# Patient Record
Sex: Female | Born: 1985 | Race: Black or African American | Hispanic: No | Marital: Single | Smoking: Current every day smoker
Health system: Southern US, Community
[De-identification: ages and names within clinical notes are randomized; demographics above are authoritative.]

## PROBLEM LIST (undated history)

## (undated) ENCOUNTER — Inpatient Hospital Stay: Payer: Self-pay

## (undated) DIAGNOSIS — I10 Essential (primary) hypertension: Secondary | ICD-10-CM

## (undated) DIAGNOSIS — Z789 Other specified health status: Secondary | ICD-10-CM

---
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
## 2006-01-07 ENCOUNTER — Emergency Department: Payer: Self-pay | Admitting: Emergency Medicine

## 2006-01-28 ENCOUNTER — Emergency Department: Payer: Self-pay | Admitting: General Practice

## 2006-04-01 ENCOUNTER — Emergency Department: Payer: Self-pay | Admitting: Emergency Medicine

## 2006-09-08 ENCOUNTER — Emergency Department: Payer: Self-pay | Admitting: Emergency Medicine

## 2006-09-23 ENCOUNTER — Emergency Department: Payer: Self-pay | Admitting: Unknown Physician Specialty

## 2006-12-10 ENCOUNTER — Observation Stay: Payer: Self-pay

## 2007-04-12 ENCOUNTER — Observation Stay: Payer: Self-pay | Admitting: Obstetrics and Gynecology

## 2007-04-21 ENCOUNTER — Observation Stay: Payer: Self-pay | Admitting: Obstetrics and Gynecology

## 2007-04-23 ENCOUNTER — Inpatient Hospital Stay: Payer: Self-pay | Admitting: Certified Nurse Midwife

## 2008-01-01 ENCOUNTER — Emergency Department: Payer: Self-pay | Admitting: Emergency Medicine

## 2008-03-15 ENCOUNTER — Observation Stay: Payer: Self-pay | Admitting: Obstetrics and Gynecology

## 2008-07-17 ENCOUNTER — Observation Stay: Payer: Self-pay | Admitting: Obstetrics and Gynecology

## 2009-10-05 ENCOUNTER — Emergency Department: Payer: Self-pay | Admitting: Emergency Medicine

## 2009-12-17 ENCOUNTER — Emergency Department: Payer: Self-pay | Admitting: Emergency Medicine

## 2010-02-05 ENCOUNTER — Emergency Department: Payer: Self-pay | Admitting: Emergency Medicine

## 2011-12-27 ENCOUNTER — Emergency Department: Payer: Self-pay | Admitting: Emergency Medicine

## 2012-04-19 ENCOUNTER — Emergency Department: Payer: Self-pay | Admitting: Emergency Medicine

## 2012-04-20 LAB — CBC
HCT: 40.9 % (ref 35.0–47.0)
HGB: 13.9 g/dL (ref 12.0–16.0)
MCH: 29 pg (ref 26.0–34.0)
MCHC: 34 g/dL (ref 32.0–36.0)
MCV: 85 fL (ref 80–100)
Platelet: 215 10*3/uL (ref 150–440)
RDW: 14.6 % — ABNORMAL HIGH (ref 11.5–14.5)
WBC: 12.4 10*3/uL — ABNORMAL HIGH (ref 3.6–11.0)

## 2012-04-20 LAB — URINALYSIS, COMPLETE
Glucose,UR: NEGATIVE mg/dL (ref 0–75)
Ketone: NEGATIVE
RBC,UR: 112 /HPF (ref 0–5)
Specific Gravity: 1.019 (ref 1.003–1.030)

## 2012-04-20 LAB — WET PREP, GENITAL

## 2012-04-20 LAB — COMPREHENSIVE METABOLIC PANEL
Albumin: 3.5 g/dL (ref 3.4–5.0)
Alkaline Phosphatase: 86 U/L (ref 50–136)
BUN: 11 mg/dL (ref 7–18)
Bilirubin,Total: 0.4 mg/dL (ref 0.2–1.0)
Calcium, Total: 8.4 mg/dL — ABNORMAL LOW (ref 8.5–10.1)
Chloride: 104 mmol/L (ref 98–107)
Creatinine: 1.02 mg/dL (ref 0.60–1.30)
EGFR (Non-African Amer.): >60
Glucose: 102 mg/dL — ABNORMAL HIGH (ref 65–99)
Osmolality: 270 (ref 275–301)
Potassium: 3.8 mmol/L (ref 3.5–5.1)
SGPT (ALT): 19 U/L (ref 12–78)
Total Protein: 7.8 g/dL (ref 6.4–8.2)

## 2012-04-20 LAB — LIPASE, BLOOD: Lipase: 91 U/L (ref 73–393)

## 2012-04-21 LAB — URINE CULTURE

## 2012-04-23 ENCOUNTER — Emergency Department: Payer: Self-pay | Admitting: Emergency Medicine

## 2012-04-23 LAB — COMPREHENSIVE METABOLIC PANEL
Albumin: 3.3 g/dL — ABNORMAL LOW (ref 3.4–5.0)
Alkaline Phosphatase: 80 U/L (ref 50–136)
Calcium, Total: 8.3 mg/dL — ABNORMAL LOW (ref 8.5–10.1)
EGFR (Non-African Amer.): >60
Glucose: 93 mg/dL (ref 65–99)
Osmolality: 276 (ref 275–301)
Potassium: 3.8 mmol/L (ref 3.5–5.1)
SGOT(AST): 14 U/L — ABNORMAL LOW (ref 15–37)
SGPT (ALT): 15 U/L (ref 12–78)
Sodium: 139 mmol/L (ref 136–145)
Total Protein: 7.4 g/dL (ref 6.4–8.2)

## 2012-04-23 LAB — DRUG SCREEN, URINE
Barbiturates, Ur Screen: NEGATIVE (ref ?–200)
Cannabinoid 50 Ng, Ur ~~LOC~~: NEGATIVE (ref ?–50)
Cocaine Metabolite,Ur ~~LOC~~: NEGATIVE (ref ?–300)
MDMA (Ecstasy)Ur Screen: NEGATIVE (ref ?–500)
Methadone, Ur Screen: NEGATIVE (ref ?–300)
Opiate, Ur Screen: NEGATIVE (ref ?–300)
Phencyclidine (PCP) Ur S: NEGATIVE (ref ?–25)

## 2012-04-23 LAB — URINALYSIS, COMPLETE
Bilirubin,UR: NEGATIVE
Glucose,UR: NEGATIVE mg/dL (ref 0–75)
Nitrite: NEGATIVE
Protein: NEGATIVE
RBC,UR: 3050 /HPF (ref 0–5)
WBC UR: 9 /HPF (ref 0–5)

## 2012-04-23 LAB — RAPID INFLUENZA A&B ANTIGENS

## 2012-04-23 LAB — CBC
HCT: 39.9 % (ref 35.0–47.0)
HGB: 13.5 g/dL (ref 12.0–16.0)
MCH: 29 pg (ref 26.0–34.0)
MCHC: 33.8 g/dL (ref 32.0–36.0)
RBC: 4.65 10*6/uL (ref 3.80–5.20)
RDW: 14.5 % (ref 11.5–14.5)
WBC: 6.7 10*3/uL (ref 3.6–11.0)

## 2013-06-14 ENCOUNTER — Emergency Department: Payer: Self-pay | Admitting: Internal Medicine

## 2013-06-14 LAB — COMPREHENSIVE METABOLIC PANEL
ALK PHOS: 77 U/L
AST: 9 U/L — AB (ref 15–37)
Albumin: 3.3 g/dL — ABNORMAL LOW (ref 3.4–5.0)
Anion Gap: 5 — ABNORMAL LOW (ref 7–16)
BUN: 10 mg/dL (ref 7–18)
Bilirubin,Total: 0.3 mg/dL (ref 0.2–1.0)
CALCIUM: 8.6 mg/dL (ref 8.5–10.1)
CHLORIDE: 110 mmol/L — AB (ref 98–107)
Co2: 24 mmol/L (ref 21–32)
Creatinine: 0.8 mg/dL (ref 0.60–1.30)
EGFR (African American): >60
EGFR (Non-African Amer.): >60
GLUCOSE: 89 mg/dL (ref 65–99)
OSMOLALITY: 276 (ref 275–301)
Potassium: 4.1 mmol/L (ref 3.5–5.1)
SGPT (ALT): 16 U/L (ref 12–78)
Sodium: 139 mmol/L (ref 136–145)
Total Protein: 7.2 g/dL (ref 6.4–8.2)

## 2013-06-14 LAB — CBC WITH DIFFERENTIAL/PLATELET
Basophil #: 0 10*3/uL (ref 0.0–0.1)
Basophil %: 0.6 %
Eosinophil #: 0.2 10*3/uL (ref 0.0–0.7)
Eosinophil %: 3.4 %
HCT: 38.2 % (ref 35.0–47.0)
HGB: 13 g/dL (ref 12.0–16.0)
LYMPHS PCT: 36.6 %
Lymphocyte #: 2.7 10*3/uL (ref 1.0–3.6)
MCH: 29.5 pg (ref 26.0–34.0)
MCHC: 34 g/dL (ref 32.0–36.0)
MCV: 87 fL (ref 80–100)
MONOS PCT: 6.4 %
Monocyte #: 0.5 x10 3/mm (ref 0.2–0.9)
Neutrophil #: 3.8 10*3/uL (ref 1.4–6.5)
Neutrophil %: 53 %
Platelet: 253 10*3/uL (ref 150–440)
RBC: 4.41 10*6/uL (ref 3.80–5.20)
RDW: 13.7 % (ref 11.5–14.5)
WBC: 7.3 10*3/uL (ref 3.6–11.0)

## 2013-06-14 LAB — URINALYSIS, COMPLETE
Bacteria: NONE SEEN
SPECIFIC GRAVITY: 1.023 (ref 1.003–1.030)
SQUAMOUS EPITHELIAL: NONE SEEN
WBC UR: NONE SEEN /HPF (ref 0–5)

## 2013-06-14 LAB — LIPASE, BLOOD: Lipase: 110 U/L (ref 73–393)

## 2013-06-14 LAB — HCG, QUANTITATIVE, PREGNANCY: Beta Hcg, Quant.: <1 m[IU]/mL — ABNORMAL LOW

## 2014-04-30 ENCOUNTER — Emergency Department: Payer: Self-pay | Admitting: Emergency Medicine

## 2015-04-02 ENCOUNTER — Emergency Department: Payer: Self-pay

## 2015-04-02 ENCOUNTER — Emergency Department
Admission: EM | Admit: 2015-04-02 | Discharge: 2015-04-02 | Disposition: A | Discharge status: Home / Self Care | Payer: Self-pay | Attending: Emergency Medicine | Admitting: Emergency Medicine

## 2015-04-02 ENCOUNTER — Encounter: Payer: Self-pay | Admitting: Emergency Medicine

## 2015-04-02 DIAGNOSIS — F17200 Nicotine dependence, unspecified, uncomplicated: Secondary | ICD-10-CM | POA: Insufficient documentation

## 2015-04-02 DIAGNOSIS — O99331 Smoking (tobacco) complicating pregnancy, first trimester: Secondary | ICD-10-CM | POA: Insufficient documentation

## 2015-04-02 DIAGNOSIS — O200 Threatened abortion: Secondary | ICD-10-CM

## 2015-04-02 DIAGNOSIS — Z3A01 Less than 8 weeks gestation of pregnancy: Secondary | ICD-10-CM | POA: Insufficient documentation

## 2015-04-02 LAB — URINALYSIS COMPLETE WITH MICROSCOPIC (ARMC ONLY)
Bilirubin Urine: NEGATIVE
Glucose, UA: NEGATIVE mg/dL
Ketones, ur: NEGATIVE mg/dL
Leukocytes, UA: NEGATIVE
Nitrite: NEGATIVE
Protein, ur: NEGATIVE mg/dL
Specific Gravity, Urine: 1.021 (ref 1.005–1.030)
pH: 6 (ref 5.0–8.0)

## 2015-04-02 LAB — BASIC METABOLIC PANEL WITH GFR
Anion gap: 5 (ref 5–15)
BUN: 15 mg/dL (ref 6–20)
CO2: 22 mmol/L (ref 22–32)
Calcium: 8.9 mg/dL (ref 8.9–10.3)
Chloride: 110 mmol/L (ref 101–111)
Creatinine, Ser: 0.84 mg/dL (ref 0.44–1.00)
GFR calc Af Amer: >60 mL/min
GFR calc non Af Amer: >60 mL/min
Glucose, Bld: 86 mg/dL (ref 65–99)
Potassium: 3.9 mmol/L (ref 3.5–5.1)
Sodium: 137 mmol/L (ref 135–145)

## 2015-04-02 LAB — CBC
HCT: 40.3 % (ref 35.0–47.0)
Hemoglobin: 13.1 g/dL (ref 12.0–16.0)
MCH: 28.7 pg (ref 26.0–34.0)
MCHC: 32.6 g/dL (ref 32.0–36.0)
MCV: 87.9 fL (ref 80.0–100.0)
PLATELETS: 254 10*3/uL (ref 150–440)
RBC: 4.58 MIL/uL (ref 3.80–5.20)
RDW: 13.8 % (ref 11.5–14.5)
WBC: 12.1 10*3/uL — AB (ref 3.6–11.0)

## 2015-04-02 LAB — HCG, QUANTITATIVE, PREGNANCY: hCG, Beta Chain, Quant, S: 27093 m[IU]/mL — ABNORMAL HIGH (ref <5)

## 2015-04-02 MED ORDER — ACETAMINOPHEN 500 MG PO TABS
1000.0000 mg | ORAL_TABLET | Freq: Once | ORAL | Status: AC
  Administered 2015-04-02: 1000 mg via ORAL
  Filled 2015-04-02: qty 2

## 2015-04-02 MED ORDER — NITROGLYCERIN 0.4 MG SL SUBL: 0.4000 mg | SUBLINGUAL_TABLET | Freq: Once | SUBLINGUAL | Status: DC

## 2015-04-02 MED ORDER — FUROSEMIDE 10 MG/ML IJ SOLN: 160.0000 mg | Freq: Once | INTRAMUSCULAR | Status: DC

## 2015-04-02 NOTE — ED Provider Notes (Signed)
[facility] Emergency Department Provider Note  Time seen: 8:35 AM  I have reviewed the triage vital signs and the nursing notes.   HISTORY  Chief Complaint Vaginal Bleeding    HPI Vicki Wiley is a 29 y.o. female with no past medical history, G3 P2, who presents the emergency department vaginal spotting. According to the patient she took a pregnancy test 2 weeks ago which was positive. She is not entirely sure when her last period was, states she had when the beginning of October and then again at the end of October. States she was on birth control until she came off of this in September. For the past 3 days if patient has noted intermittent vaginal spotting, with mild lower abdominal cramping. She has been taking Tylenol for the cramping with some relief. Denies any passage of tissue.     History reviewed. No pertinent past medical history.  There are no active problems to display for this patient.   History reviewed. No pertinent past surgical history.  No current outpatient prescriptions on file.  Allergies Review of patient's allergies indicates no known allergies.  History reviewed. No pertinent family history.  Social History Social History  Substance Use Topics  . Smoking status: Current Every Day Smoker  . Smokeless tobacco: None  . Alcohol Use: No    Review of Systems Constitutional: Negative for fever. Cardiovascular: Negative for chest pain. Respiratory: Negative for shortness of breath. Gastrointestinal: Mild lower abdominal cramping. Negative nausea, vomiting, diarrhea. Genitourinary: Negative for dysuria Neurological: Negative for headache 10-point ROS otherwise negative.  ____________________________________________   PHYSICAL EXAM:  VITAL SIGNS: ED Triage Vitals  Enc Vitals Group     BP 04/02/15 0823 129/71 mmHg     Pulse Rate 04/02/15 0823 105     Resp 04/02/15 0823 20     Temp 04/02/15 0823 98 F (36.7 C)   Temp Source 04/02/15 0823 Oral     SpO2 04/02/15 0823 100 %     Weight 04/02/15 0823 203 lb (92.08 kg)     Height 04/02/15 0823  (1.676 m)     Head Cir --      Peak Flow --      Pain Score 04/02/15 0823 8     Pain Loc --      Pain Edu? --      Excl. in GC? --     Constitutional: Alert and oriented. Well appearing and in no distress. Eyes: Normal exam ENT   Head: Normocephalic and atraumatic.   Mouth/Throat: Mucous membranes are moist. Cardiovascular: Normal rate, regular rhythm. No murmur Respiratory: Normal respiratory effort without tachypnea nor retractions. Breath sounds are clear Gastrointestinal: Soft, mild lower abdominal tenderness palpation. No rebound or guarding. No distention. Musculoskeletal: Nontender with normal range of motion in all extremities. Neurologic:  Normal speech and language. No gross focal neurologic deficits Skin:  Skin is warm, dry and intact.  Psychiatric: Mood and affect are normal. Speech and behavior are normal.  ____________________________________________   RADIOLOGY  Single intrauterine pregnancy with heart rate of 152  ____________________________________________    INITIAL IMPRESSION / ASSESSMENT AND PLAN / ED COURSE  Pertinent labs & imaging results that were available during my care of the patient were reviewed by me and considered in my medical decision making (see chart for details).  Patient presents the emergency department with vaginal spotting, and a positive pregnancy test. We'll check labs including beta hCG, ultrasound, urinalysis, and closely monitor in the  emergency department.  Ultrasound consistent with single intrauterine pregnancy. Patient does have a cystic structure adjacent to the left ovary which has been discussed with the patient she'll follow-up with her OB/GYN regarding this. We will discharge the patient home at this time.  ____________________________________________   FINAL CLINICAL  IMPRESSION(S) / ED DIAGNOSES  Threatened abortion   Minna AntisKevin Honesty Menta, MD 04/02/15 1308

## 2015-04-02 NOTE — ED Notes (Signed)
Patient transported to Ultrasound 

## 2015-04-02 NOTE — Discharge Instructions (Signed)
You have been seen in the emergency department for vaginal bleeding. Her workup today is consistent with a early pregnancy. Please follow-up your OB/GYN for further prenatal care. Your ultrasound also shows a left ovarian cyst, please follow-up with OB/GYN regarding this to ensure resolution.   Threatened Miscarriage A threatened miscarriage occurs when you have vaginal bleeding during your first 20 weeks of pregnancy but the pregnancy has not ended. If you have vaginal bleeding during this time, your health care provider will do tests to make sure you are still pregnant. If the tests show you are still pregnant and the developing baby (fetus) inside your womb (uterus) is still growing, your condition is considered a threatened miscarriage. A threatened miscarriage does not mean your pregnancy will end, but it does increase the risk of losing your pregnancy (complete miscarriage). CAUSES  The cause of a threatened miscarriage is usually not known. If you go on to have a complete miscarriage, the most common cause is an abnormal number of chromosomes in the developing baby. Chromosomes are the structures inside cells that hold all your genetic material. Some causes of vaginal bleeding that do not result in miscarriage include:  Having sex.  Having an infection.  Normal hormone changes of pregnancy.  Bleeding that occurs when an egg implants in your uterus. RISK FACTORS Risk factors for bleeding in early pregnancy include:  Obesity.  Smoking.  Drinking excessive amounts of alcohol or caffeine.  Recreational drug use. SIGNS AND SYMPTOMS  Light vaginal bleeding.  Mild abdominal pain or cramps. DIAGNOSIS  If you have bleeding with or without abdominal pain before 20 weeks of pregnancy, your health care provider will do tests to check whether you are still pregnant. One important test involves using sound waves and a computer (ultrasound) to create images of the inside of your uterus.  Other tests include an internal exam of your vagina and uterus (pelvic exam) and measurement of your baby's heart rate.  You may be diagnosed with a threatened miscarriage if:  Ultrasound testing shows you are still pregnant.  Your baby's heart rate is strong.  A pelvic exam shows that the opening between your uterus and your vagina (cervix) is closed.  Your heart rate and blood pressure are stable.  Blood tests confirm you are still pregnant. TREATMENT  No treatments have been shown to prevent a threatened miscarriage from going on to a complete miscarriage. However, the right home care is important.  HOME CARE INSTRUCTIONS   Make sure you keep all your appointments for prenatal care. This is very important.  Get plenty of rest.  Do not have sex or use tampons if you have vaginal bleeding.  Do not douche.  Do not smoke or use recreational drugs.  Do not drink alcohol.  Avoid caffeine. SEEK MEDICAL CARE IF:  You have light vaginal bleeding or spotting while pregnant.  You have abdominal pain or cramping.  You have a fever. SEEK IMMEDIATE MEDICAL CARE IF:  You have heavy vaginal bleeding.  You have blood clots coming from your vagina.  You have severe low back pain or abdominal cramps.  You have fever, chills, and severe abdominal pain. MAKE SURE YOU:  Understand these instructions.  Will watch your condition.  Will get help right away if you are not doing well or get worse.   This information is not intended to replace advice given to you by your health care provider. Make sure you discuss any questions you have with your health care  provider.   Document Released: 04/02/2005 Document Revised: 04/07/2013 Document Reviewed: 01/27/2013 Elsevier Interactive Patient Education [employer]2016 [employer] Inc.

## 2015-04-02 NOTE — ED Notes (Signed)
Pt to ed with c/o vaginal bleeding and spotting,  Pt reports she has had 2 positive pregnancy tests in the last 2 weeks.

## 2015-08-22 ENCOUNTER — Encounter: Payer: Self-pay | Admitting: Emergency Medicine

## 2015-08-22 ENCOUNTER — Emergency Department
Admission: EM | Admit: 2015-08-22 | Discharge: 2015-08-22 | Disposition: A | Discharge status: Home / Self Care | Payer: Medicaid Other | Attending: Emergency Medicine | Admitting: Emergency Medicine

## 2015-08-22 ENCOUNTER — Emergency Department: Payer: Medicaid Other

## 2015-08-22 DIAGNOSIS — O26891 Other specified pregnancy related conditions, first trimester: Secondary | ICD-10-CM | POA: Diagnosis not present

## 2015-08-22 DIAGNOSIS — O26899 Other specified pregnancy related conditions, unspecified trimester: Secondary | ICD-10-CM

## 2015-08-22 DIAGNOSIS — R102 Pelvic and perineal pain: Secondary | ICD-10-CM | POA: Insufficient documentation

## 2015-08-22 DIAGNOSIS — F17200 Nicotine dependence, unspecified, uncomplicated: Secondary | ICD-10-CM | POA: Insufficient documentation

## 2015-08-22 DIAGNOSIS — R1032 Left lower quadrant pain: Secondary | ICD-10-CM | POA: Diagnosis present

## 2015-08-22 DIAGNOSIS — O200 Threatened abortion: Secondary | ICD-10-CM | POA: Diagnosis not present

## 2015-08-22 DIAGNOSIS — Z3A01 Less than 8 weeks gestation of pregnancy: Secondary | ICD-10-CM | POA: Insufficient documentation

## 2015-08-22 LAB — URINALYSIS COMPLETE WITH MICROSCOPIC (ARMC ONLY)
BILIRUBIN URINE: NEGATIVE
GLUCOSE, UA: NEGATIVE mg/dL
KETONES UR: NEGATIVE mg/dL
LEUKOCYTES UA: NEGATIVE
Nitrite: NEGATIVE
PH: 6 (ref 5.0–8.0)
Protein, ur: NEGATIVE mg/dL
Specific Gravity, Urine: 1.02 (ref 1.005–1.030)

## 2015-08-22 LAB — CHLAMYDIA/NGC RT PCR (ARMC ONLY)
Chlamydia Tr: NOT DETECTED
N GONORRHOEAE: NOT DETECTED

## 2015-08-22 LAB — ABO/RH: ABO/RH(D): A POS

## 2015-08-22 LAB — CBC
HCT: 42.2 % (ref 35.0–47.0)
Hemoglobin: 14.1 g/dL (ref 12.0–16.0)
MCH: 29.2 pg (ref 26.0–34.0)
MCHC: 33.4 g/dL (ref 32.0–36.0)
MCV: 87.5 fL (ref 80.0–100.0)
PLATELETS: 242 10*3/uL (ref 150–440)
RBC: 4.82 MIL/uL (ref 3.80–5.20)
RDW: 13.5 % (ref 11.5–14.5)
WBC: 13.1 10*3/uL — ABNORMAL HIGH (ref 3.6–11.0)

## 2015-08-22 LAB — HCG, QUANTITATIVE, PREGNANCY: hCG, Beta Chain, Quant, S: 61887 m[IU]/mL — ABNORMAL HIGH (ref <5)

## 2015-08-22 LAB — WET PREP, GENITAL
Clue Cells Wet Prep HPF POC: NONE SEEN
SPERM: NONE SEEN
TRICH WET PREP: NONE SEEN
Yeast Wet Prep HPF POC: NONE SEEN

## 2015-08-22 NOTE — ED Provider Notes (Signed)
[facility] Emergency Department Provider Note   ____________________________________________  Time seen: Approximately 2:15 PM  I have reviewed the triage vital signs and the nursing notes.   HISTORY  Chief Complaint Abdominal Pain    HPI Vicki Wiley is a 30 y.o. female who is a G3 P1 who is presenting to the emergency department today with left lower quadrant abdominal pain as well as vaginal spotting. She says that she is about [redacted] weeks pregnant. Is taking prenatal vitamins. Has a history of a left-sided ovarian cyst. Says that the pain is pressure-like and an 8 out of 10 to her left side. Said that she took Tylenol without any relief. Denies any radiation of the pain. Says that she also has a history of kidney stones. [facility]. Says that she smokes but is down to 2 cigarettes per day and is trying to quit completely.   History reviewed. No pertinent past medical history.  There are no active problems to display for this patient.   History reviewed. No pertinent past surgical history.  No current outpatient prescriptions on file.  Allergies Review of patient's allergies indicates no known allergies.  History reviewed. No pertinent family history.  Social History Social History  Substance Use Topics  . Smoking status: Current Every Day Smoker  . Smokeless tobacco: None  . Alcohol Use: No    Review of Systems Constitutional: No fever/chills Eyes: No visual changes. ENT: No sore throat. Cardiovascular: Denies chest pain. Respiratory: Denies shortness of breath. Gastrointestinal:No nausea, no vomiting.  No diarrhea.  No constipation. Genitourinary: Negative for dysuria. Musculoskeletal: Negative for back pain. Skin: Negative for rash. Neurological: Negative for headaches, focal weakness or numbness.  10-point ROS otherwise  negative.  ____________________________________________   PHYSICAL EXAM:  VITAL SIGNS: ED Triage Vitals  Enc Vitals Group     BP 08/22/15 1112 118/66 mmHg     Pulse Rate 08/22/15 1112 84     Resp 08/22/15 1112 20     Temp 08/22/15 1112 97.8 F (36.6 C)     Temp Source 08/22/15 1112 Oral     SpO2 08/22/15 1112 99 %     Weight 08/22/15 1112 232 lb (105.235 kg)     Height 08/22/15 1112 5\' 6"  (1.676 m)     Head Cir --      Peak Flow --      Pain Score 08/22/15 1113 8     Pain Loc --      Pain Edu? --      Excl. in GC? --     Constitutional: Alert and oriented. Well appearing and in no acute distress. Eyes: Conjunctivae are normal. PERRL. EOMI. Head: Atraumatic. Nose: No congestion/rhinnorhea. Mouth/Throat: Mucous membranes are moist.   Neck: No stridor.   Cardiovascular: Normal rate, regular rhythm. Grossly normal heart sounds.  Good peripheral circulation. Respiratory: Normal respiratory effort.  No retractions. Lungs CTAB. Gastrointestinal: Soft With mild left lower quadrant tenderness. No distention.  No CVA tenderness. Genitourinary:  Normal external appearance. Speculum exam with a small amount of clear discharge. Bimanual exam with a closed cervix. No CMT. No uterine or right adnexal tenderness or masses. Mild to moderate tenderness to the left adnexa. Musculoskeletal: No lower extremity tenderness nor edema.  No joint effusions. Neurologic:  Normal speech and language. No gross focal neurologic deficits are appreciated. No gait instability. Skin:  Skin is warm, dry and intact. No rash noted. Psychiatric: Mood  and affect are normal. Speech and behavior are normal.  ____________________________________________   LABS (all labs ordered are listed, but only abnormal results are displayed)  Labs Reviewed  WET PREP, GENITAL - Abnormal; Notable for the following:    WBC, Wet Prep HPF POC FEW (*)    All other components within normal limits  CBC - Abnormal; Notable for  the following:    WBC 13.1 (*)    All other components within normal limits  HCG, QUANTITATIVE, PREGNANCY - Abnormal; Notable for the following:    hCG, Beta Chain, Sharene Butters, [place] [address] (*)    All other components within normal limits  URINALYSIS COMPLETEWITH MICROSCOPIC ([facility] ONLY) - Abnormal; Notable for the following:    Color, Urine AMBER (*)    APPearance CLEAR (*)    Hgb urine dipstick 1+ (*)    Bacteria, UA RARE (*)    Squamous Epithelial / LPF 6-30 (*)    All other components within normal limits  CHLAMYDIA/NGC RT PCR ([facility] ONLY)  ABO/RH   ____________________________________________  EKG   ____________________________________________  RADIOLOGY  Pending ultrasound ____________________________________________   PROCEDURES   ____________________________________________   INITIAL IMPRESSION / ASSESSMENT AND PLAN / ED COURSE  Pertinent labs & imaging results that were available during my care of the patient were reviewed by me and considered in my medical decision making (see chart for details).  ----------------------------------------- 3:35 PM on 08/22/2015 -----------------------------------------  Patient's labs are reassuring. Pending ultrasound at this time. Signed out to Dr. York Cerise. Suspect likely threatened abortion. Patient not in any outward distress. Less likely to be kidney stones. Clear discharge likely physiologic with pregnancy. Has elevated white blood cell count appears chronic. ____________________________________________   FINAL CLINICAL IMPRESSION(S) / ED DIAGNOSES  Final diagnoses:  Pelvic pain affecting pregnancy  Threatened abortion      NEW MEDICATIONS STARTED DURING THIS VISIT:  New Prescriptions   No medications on file     Note:  This document was prepared using Dragon voice recognition software and may include unintentional dictation errors.    Myrna Blazer, MD 08/22/15 [phone]

## 2015-08-22 NOTE — ED Notes (Signed)
Patient transported to Ultrasound 

## 2015-08-22 NOTE — ED Provider Notes (Signed)
----------------------------------------- 3:22 PM on 08/22/2015 -----------------------------------------   Blood pressure 98/63, pulse 70, temperature 97.8 F (36.6 C), temperature source Oral, resp. rate 20, height 5\' 6"  (1.676 m), weight 105.235 kg, last menstrual period 02/14/2015, SpO2 100 %.  Assuming care from Dr. Pershing ProudSchaevitz.  In short, Vicki Wiley is a 30 y.o. female with a chief complaint of Abdominal Pain .  Refer to the original H&P for additional details.  The current plan of care is to follow-up the ultrasounds and GC/chlamydia.  ----------------------------------------- 4:42 PM on 08/22/2015 -----------------------------------------  [place] Ob Comp Less 14 Wks  08/22/2015  CLINICAL DATA:  Left lower quadrant abdominal pain, vaginal spotting, first trimester of pregnancy. EXAM: OBSTETRIC <14 WK US AND TRANSVAGINAL OB US DOPPLER ULTRASOUND OF OVARIES TECHNIQUE: Both transabdominal and transvaginal ultrasound examinations were performed for complete evaluation of the gestation as well as the maternal uterus, adnexal regions, and pelvic cul-de-sac. Transvaginal technique was performed to assess early pregnancy. Color and duplex Doppler ultrasound was utilized to evaluate blood flow to the ovaries. COMPARISON:  Ultrasound of April 02, 2015. FINDINGS: Intrauterine gestational sac: Single. Yolk sac:  Visualized. Embryo:  Visualized. Cardiac Activity: Visualized. Heart Rate: 120 bpm CRL:   8  mm   6 w 5 d                  US EDC: April 11, 2016. Subchorionic hemorrhage:  None visualized. Maternal uterus/adnexae: Small amount of free fluid is noted. Probable corpus luteum cyst seen in right ovary. No significant abnormality seen in left ovary. Pulsed Doppler evaluation of both ovaries demonstrates normal appearing low-resistance arterial and venous waveforms. IMPRESSION: Single live intrauterine gestation of 6 weeks 5 days. Electronically Signed   By: Lupita RaiderJames  Green Jr, M.D.   On: 08/22/2015  16:07   [place] Ob Transvaginal  08/22/2015  CLINICAL DATA:  Left lower quadrant abdominal pain, vaginal spotting, first trimester of pregnancy. EXAM: OBSTETRIC <14 WK US AND TRANSVAGINAL OB US DOPPLER ULTRASOUND OF OVARIES TECHNIQUE: Both transabdominal and transvaginal ultrasound examinations were performed for complete evaluation of the gestation as well as the maternal uterus, adnexal regions, and pelvic cul-de-sac. Transvaginal technique was performed to assess early pregnancy. Color and duplex Doppler ultrasound was utilized to evaluate blood flow to the ovaries. COMPARISON:  Ultrasound of April 02, 2015. FINDINGS: Intrauterine gestational sac: Single. Yolk sac:  Visualized. Embryo:  Visualized. Cardiac Activity: Visualized. Heart Rate: 120 bpm CRL:   8  mm   6 w 5 d                  US EDC: April 11, 2016. Subchorionic hemorrhage:  None visualized. Maternal uterus/adnexae: Small amount of free fluid is noted. Probable corpus luteum cyst seen in right ovary. No significant abnormality seen in left ovary. Pulsed Doppler evaluation of both ovaries demonstrates normal appearing low-resistance arterial and venous waveforms. IMPRESSION: Single live intrauterine gestation of 6 weeks 5 days. Electronically Signed   By: Lupita RaiderJames  Green Jr, M.D.   On: 08/22/2015 16:07   [place] Art/ven Flow Abd Pelv Doppler  08/22/2015  CLINICAL DATA:  Left lower quadrant abdominal pain, vaginal spotting, first trimester of pregnancy. EXAM: OBSTETRIC <14 WK US AND TRANSVAGINAL OB US DOPPLER ULTRASOUND OF OVARIES TECHNIQUE: Both transabdominal and transvaginal ultrasound examinations were performed for complete evaluation of the gestation as well as the maternal uterus, adnexal regions, and pelvic cul-de-sac. Transvaginal technique was performed to assess early pregnancy. Color and duplex Doppler ultrasound was utilized to evaluate blood  flow to the ovaries. COMPARISON:  Ultrasound of April 02, 2015. FINDINGS: Intrauterine gestational  sac: Single. Yolk sac:  Visualized. Embryo:  Visualized. Cardiac Activity: Visualized. Heart Rate: 120 bpm CRL:   8  mm   6 w 5 d                  [place] EDC: April 11, 2016. Subchorionic hemorrhage:  None visualized. Maternal uterus/adnexae: Small amount of free fluid is noted. Probable corpus luteum cyst seen in right ovary. No significant abnormality seen in left ovary. Pulsed Doppler evaluation of both ovaries demonstrates normal appearing low-resistance arterial and venous waveforms. IMPRESSION: Single live intrauterine gestation of 6 weeks 5 days. Electronically Signed   By: Lupita Raider, M.D.   On: 08/22/2015 16:07     Ultrasounds unremarkable.  GC/chlamydia negative.  Wet prep negative.  I provided all the results to the patient and encourage outpatient follow-up as per Dr. Pershing Proud recommendations.  Loleta Rose, MD 08/22/15 [phone]

## 2015-08-22 NOTE — ED Notes (Signed)
Resumed care from amber rn.  Pt in u/s.

## 2015-08-22 NOTE — ED Notes (Signed)
Pt left without signing esignature.  Pt did not get discharge instructions.   Pt not in room at time of discharge.

## 2015-08-22 NOTE — ED Notes (Signed)
Pt alert.  Pt states she has vaginal discharge.

## 2015-08-22 NOTE — ED Notes (Signed)
Pt reports left side abdominal pain. She is [redacted] weeks pregnant and has hx of ovarian cyst. Pt also reports some light spotting since yesterday. Pt alert & oriented with NAD noted.

## 2015-09-22 ENCOUNTER — Ambulatory Visit
Admission: EM | Admit: 2015-09-22 | Discharge: 2015-09-22 | Disposition: A | Discharge status: Home / Self Care | Payer: Self-pay | Attending: Family Medicine | Admitting: Family Medicine

## 2015-09-22 DIAGNOSIS — Z3491 Encounter for supervision of normal pregnancy, unspecified, first trimester: Secondary | ICD-10-CM

## 2015-09-22 DIAGNOSIS — Z331 Pregnant state, incidental: Secondary | ICD-10-CM

## 2015-09-22 HISTORY — DX: Other specified health status: Z78.9

## 2015-09-22 NOTE — ED Provider Notes (Signed)
CSN: [id number]     Arrival date & time 09/22/15  0816 History   First MD Initiated Contact with Patient 09/22/15 [phone]    Nurses notes were reviewed. Chief Complaint  Patient presents with  . New Evaluation    Pt reports [redacted] weeks pregnant and had [facility], then [facility], then had f/u appt at health dept. No heartbeat on doppler and go worried.     Patient is here because of concern of fetal well-being. She reports that she is [redacted] weeks pregnant and she knows this because 2 weeks ago she was having vaginal bleeding and cramping and [facility] which revealed that she was [redacted] weeks pregnant that time. Since then the cramping stopped vaginal bleeding. Her things going well and she went to the health department [place] yesterday at 10 weeks. Apparently they could not hear the fetal heart tones in the Doppler study and because he did not seem to the hospital doing anything she became worried and concerned and came here for further evaluation and treatment. She is [redacted] weeks pregnant at this time and regular Doppler was use to try to hear fetal heart tones.   She is a gravida 4 para 1. She did report having preeclampsia during her first pregnancy. She also smoked up until this pregnancy but she has stopped now.  She should be noted the patient is having no problems right now with pregnancy she denies any active bleeding cramping burning on urination or frequency or headaches. She does feel bloated with gas is coming from the pregnancy she feels and she's had some morning sickness which she states that she thinks a combination of the prenatal vitamins and her smoking and that one was while she has stopped smoking now and no pertinent past surgical history no pertinent past family medical history and she denies any other medical problems at this time either.  (Consider  location/radiation/quality/duration/timing/severity/associated sxs/prior Treatment) Patient is a 30 y.o. female presenting with pregnancy problem. The history is provided by the patient. No language interpreter was used.  Possible Pregnancy This is a new problem. The current episode started more than 1 week ago. The problem occurs constantly. The problem has not changed since onset.Pertinent negatives include no chest pain and no abdominal pain. Nothing aggravates the symptoms. She has tried nothing for the symptoms. The treatment provided no relief.    Past Medical History  Diagnosis Date  . Patient denies medical problems    History reviewed. No pertinent past surgical history. History reviewed. No pertinent family history. Social History  Substance Use Topics  . Smoking status: Former Smoker    Types: Cigarettes  . Smokeless tobacco: None  . Alcohol Use: No   OB History    Gravida Para Term Preterm AB TAB SAB Ectopic Multiple Living   4         1     Review of Systems  Cardiovascular: Negative for chest pain.  Gastrointestinal: Negative for abdominal pain.  All other systems reviewed and are negative.   Allergies  Review of patient's allergies indicates no known allergies.  Home Medications   Prior to Admission medications   Medication Sig Start Date End Date Taking? Authorizing Provider  Prenatal Vit-Fe Fumarate-FA (PRENATAL MULTIVITAMIN) TABS tablet Take 1 tablet by mouth daily at 12 noon.   Yes Historical Provider, MD   Meds Ordered and Administered this Visit  Medications - No data to display  BP 115/69 mmHg  Pulse 86  Temp(Src) 97.7 F (36.5 C) (Oral)  Resp 18  Ht  (1.676 m)  Wt 244 lb (110.678 kg)  BMI 39.40 kg/m2  SpO2 100%  LMP 02/14/2015 (Within Weeks) No data found.   Physical Exam  Constitutional: She is oriented to person, place, and time. She appears well-developed and well-nourished.  HENT:  Head: Normocephalic and atraumatic.  Eyes:  Conjunctivae are normal. Pupils are equal, round, and reactive to light.  Neck: Neck supple.  Cardiovascular: Normal rate and regular rhythm.   Pulmonary/Chest: Effort normal.  Abdominal: Soft. There is no CVA tenderness.  Musculoskeletal: Normal range of motion.  Neurological: She is alert and oriented to person, place, and time.  Skin: Skin is warm.  Vitals reviewed.   ED Course  Procedures (including critical care time)  Labs Review Labs Reviewed - No data to display  Imaging Review No results found.   Visual Acuity Review  Right Eye Distance:   Left Eye Distance:   Bilateral Distance:    Right Eye Near:   Left Eye Near:    Bilateral Near:         MDM   1. First trimester pregnancy    At this time explain to patient that is not unusual not to hear fetal heart tones at 10 weeks. And since we do not have an updated Doppler I would not even try to hear it. At the health department the Doppler study may be more up-to-date but still not hearing fetal heart tones at 10 weeks does not mean anything. She's had no symptoms indicating that she may have had a miscarriage at this time and she is also already has had ultrasound 8 weeks showing a intrauterine pregnancy. I would recommend is follow back at the health department in 4 weeks ago for that time they should be reviewed here fetal heart tones. I will see she starts having bleeding hemorrhaging or some other problems and she'll need to be reevaluated.  Also recommend have a blood pressure recheck since she has had a history of preeclampsia and blood pressure 1 rechecked was within a more acceptable parameters of 115/69.Marland Kitchen   Note: This dictation was prepared with Dragon dictation along with smaller phrase technology. Any transcriptional errors that result from this process are unintentional.    Hassan Rowan, MD 09/22/15 1027

## 2015-09-22 NOTE — Discharge Instructions (Signed)

## 2015-11-21 DIAGNOSIS — Z8659 Personal history of other mental and behavioral disorders: Secondary | ICD-10-CM | POA: Insufficient documentation

## 2015-12-14 ENCOUNTER — Ambulatory Visit
Admission: EM | Admit: 2015-12-14 | Discharge: 2015-12-14 | Disposition: A | Discharge status: Home / Self Care | Payer: Medicaid Other | Attending: Family Medicine | Admitting: Family Medicine

## 2015-12-14 DIAGNOSIS — N751 Abscess of Bartholin's gland: Secondary | ICD-10-CM | POA: Diagnosis not present

## 2015-12-14 MED ORDER — CLINDAMYCIN HCL 300 MG PO CAPS: 300.0000 mg | ORAL_CAPSULE | Freq: Four times a day (QID) | ORAL | 0 refills | Status: AC

## 2015-12-14 MED ORDER — AMOXICILLIN-POT CLAVULANATE 875-125 MG PO TABS: 1.0000 | ORAL_TABLET | Freq: Two times a day (BID) | ORAL | 0 refills | Status: AC

## 2015-12-14 MED ORDER — ACETAMINOPHEN 500 MG PO TABS: 1000.0000 mg | ORAL_TABLET | Freq: Four times a day (QID) | ORAL | 0 refills | Status: DC | PRN

## 2015-12-14 NOTE — Discharge Instructions (Signed)
Warm compress or sitz bath's four times a day x 15 minutes

## 2015-12-14 NOTE — ED Provider Notes (Signed)
CSN: [id number]     Arrival date & time 12/14/15  0915 History   First MD Initiated Contact with Patient 12/14/15 [phone]     Chief Complaint  Patient presents with  . Abscess   (Consider location/radiation/quality/duration/timing/severity/associated sxs/prior Treatment) Single african [place] female here for evaluation of swollen left labia, tender, painful to sit "I think I have a boil"  5 months pregnant next OB appt 26 Dec 2015.  Has never had this happen before.  Denied new sexual partners; active with fetus' father  Works as Lawyer requesting work note.  Unable to wear underwear due to pain  Taking tylenol OTC po prn pain and sitz baths without relief/swelling worsening      Past Medical History:  Diagnosis Date  . Patient denies medical problems    Past Surgical History:  Procedure Laterality Date  . CESAREAN SECTION     History reviewed. No pertinent family history. Social History  Substance Use Topics  . Smoking status: Former Smoker    Types: Cigarettes  . Smokeless tobacco: Never Used  . Alcohol use No   OB History    Gravida Para Term Preterm AB Living   4         1   SAB TAB Ectopic Multiple Live Births                 Review of Systems  Constitutional: Negative for activity change, appetite change, chills, diaphoresis, fatigue and fever.  HENT: Negative for congestion, ear pain and sore throat.   Eyes: Negative for pain and visual disturbance.  Respiratory: Negative for cough and shortness of breath.   Cardiovascular: Negative for chest pain and palpitations.  Gastrointestinal: Negative for abdominal pain and vomiting.  Endocrine: Negative for polydipsia, polyphagia and polyuria.  Genitourinary: Negative for decreased urine volume, difficulty urinating, dysuria, flank pain, frequency, genital sores, hematuria, menstrual problem, pelvic pain, urgency, vaginal bleeding, vaginal discharge and vaginal pain.  Musculoskeletal: Negative for arthralgias, back pain, gait  problem, joint swelling, myalgias, neck pain and neck stiffness.  Skin: Positive for color change and rash. Negative for pallor and wound.  Allergic/Immunologic: Negative for environmental allergies and food allergies.  Neurological: Negative for seizures and syncope.  Hematological: Negative for adenopathy. Does not bruise/bleed easily.  Psychiatric/Behavioral: Negative for sleep disturbance.  All other systems reviewed and are negative.   Allergies  Review of patient's allergies indicates no known allergies.  Home Medications   Prior to Admission medications   Medication Sig Start Date End Date Taking? Authorizing Provider  Prenatal Vit-Fe Fumarate-FA (PRENATAL MULTIVITAMIN) TABS tablet Take 1 tablet by mouth daily at 12 noon.   Yes Historical Provider, MD  acetaminophen (TYLENOL) 500 MG tablet Take 2 tablets (1,000 mg total) by mouth every 6 (six) hours as needed. 12/14/15   Barbaraann Barthel, NP  amoxicillin-clavulanate (AUGMENTIN) 875-125 MG tablet Take 1 tablet by mouth every 12 (twelve) hours. 12/14/15 12/21/15  Barbaraann Barthel, NP  clindamycin (CLEOCIN) 300 MG capsule Take 1 capsule (300 mg total) by mouth 4 (four) times daily. 12/14/15 12/21/15  Barbaraann Barthel, NP   Meds Ordered and Administered this Visit  Medications - No data to display  BP 110/86 (BP Location: Left Arm)   Pulse 100   Temp 98.1 F (36.7 C) (Oral)   Resp 16   Ht 5\' 6"  (1.676 m)   Wt 244 lb (110.7 kg)   LMP 07/09/2015   SpO2 98%   BMI 39.38 kg/m  No data  found.   Physical Exam  Constitutional: Vital signs are normal. She appears well-developed and well-nourished. She is cooperative.  Non-toxic appearance. She does not have a sickly appearance. She does not appear ill. No distress.  HENT:  Head: Normocephalic and atraumatic.  Right Ear: Hearing and external ear normal.  Left Ear: Hearing and external ear normal.  Nose: Nose normal.  Mouth/Throat: Uvula is midline, oropharynx is clear and moist and  mucous membranes are normal. No oropharyngeal exudate, posterior oropharyngeal edema or posterior oropharyngeal erythema.  Eyes: Conjunctivae, EOM and lids are normal. Pupils are equal, round, and reactive to light. Right eye exhibits no discharge. Left eye exhibits no discharge. Right conjunctiva is not injected. Right conjunctiva has no hemorrhage. Left conjunctiva is not injected. Left conjunctiva has no hemorrhage. No scleral icterus. Right eye exhibits normal extraocular motion and no nystagmus. Left eye exhibits normal extraocular motion and no nystagmus.  Neck: Normal range of motion. Neck supple. No thyromegaly present.  Cardiovascular: Normal rate and regular rhythm.   No murmur heard. Pulses:      Radial pulses are 2+ on the right side, and 2+ on the left side.  Pulmonary/Chest: Effort normal and breath sounds normal. No stridor. No respiratory distress. She has no decreased breath sounds. She has no wheezes. She has no rhonchi. She has no rales.  Abdominal: Soft. Normal appearance and bowel sounds are normal. She exhibits no shifting dullness, no distension, no pulsatile liver, no fluid wave, no abdominal bruit, no ascites, no pulsatile midline mass and no mass. There is no hepatosplenomegaly. There is no tenderness. There is no rigidity, no rebound, no guarding, no CVA tenderness, no tenderness at McBurney's point and negative Murphy's sign. No hernia. Hernia confirmed negative in the ventral area, confirmed negative in the right inguinal area and confirmed negative in the left inguinal area.  Dull to percussion x 4 quads; uterus at umbilical level enlarged; FHTs to left of umbilicus with doppler  Genitourinary:    Pelvic exam was performed with patient supine. No labial fusion. There is rash and tenderness on the right labia. There is no lesion or injury on the right labia. There is no rash, tenderness, lesion or injury on the left labia.  Genitourinary Comments: Patient refused chaperone  during genital exam  Musculoskeletal: Normal range of motion. She exhibits no edema, tenderness or deformity.       Right shoulder: Normal.       Left shoulder: Normal.       Right elbow: Normal.      Left elbow: Normal.       Right hip: Normal.       Left hip: Normal.       Right knee: Normal.       Left knee: Normal.       Right ankle: Normal.       Left ankle: Normal.       Cervical back: Normal.       Thoracic back: Normal.       Lumbar back: Normal.       Right hand: Normal.       Left hand: Normal.  Lymphadenopathy:    She has no cervical adenopathy.       Right: No inguinal adenopathy present.       Left: No inguinal adenopathy present.  Neurological: She is alert. She exhibits normal muscle tone. Coordination normal.  Skin: Skin is warm and dry. Capillary refill takes less than 2 seconds. Rash noted. No  purpura noted. Rash is macular. Rash is not papular, not maculopapular, not nodular, not pustular, not vesicular and not urticarial. She is not diaphoretic. There is erythema. No cyanosis. No pallor. Nails show no clubbing.  Psychiatric: She has a normal mood and affect. Her speech is normal and behavior is normal. Judgment and thought content normal. Cognition and memory are normal.  Nursing note and vitals reviewed.   Urgent Care Course   Clinical Course    Procedures (including critical care time)  Labs Review Labs Reviewed - No data to display  Imaging Review No results found.  Case discussed with Dr Judd Gaudieronty reviewed up to date.  Discussed with patient Clindamycin and Augmentin pregnancy category B. Work excuse x 48 hours.  Fetal heart tones 150 and fetus moving. Follow up with OB provider Friday if worsening and no improvement of symptoms with plan of care.  Patient verbalized understanding of information/instructions, agreed with plan of care and had no further questions at this time.    MDM   1. Bartholin's gland abscess    Left labia majora with bartholin  gland abscess possible cyst.  Contact OB office and schedule follow up appt for Friday.  Start clindamycin 300mg  po QID and augmentin 875mg  po BID x 7 days this am.  Sitz baths/warm compresses QID.  Work excuse x 48 hours.  Do not scratch or pick at affected area.  Tylenol 1000mg  po QID prn pain.  If unable to urinate every 8 hours or stool  Usual pattern  seek immediate re-evaluation.  Exitcare handout on bartholin gland cyst/abscess, sitz baths given to patient.  Discussed pregnancy category B per up to date for both medications.  Discussed sometimes these cysts/abscesses require incision and drainage and Word catheter placement by OB/Gyn.  Patient verbalized understanding information/instructions, agreed with plan of care and had no further questions at this time.   Barbaraann Barthelina A Betancourt, NP 12/14/15 1126

## 2015-12-14 NOTE — ED Triage Notes (Signed)
Patient states that she has a large abscess/boil on labia. Patient states that she originally noticed area around 2 weeks ago and thought that this would pop on its own. Patient states that now area has grown in size and is very painful. Patient also reports that she is 5 months pregnant.

## 2016-02-03 ENCOUNTER — Encounter: Payer: Self-pay | Admitting: *Deleted

## 2016-02-03 ENCOUNTER — Ambulatory Visit
Admission: EM | Admit: 2016-02-03 | Discharge: 2016-02-03 | Disposition: A | Discharge status: Home / Self Care | Payer: Medicaid Other | Attending: Family Medicine | Admitting: Family Medicine

## 2016-02-03 DIAGNOSIS — R42 Dizziness and giddiness: Secondary | ICD-10-CM | POA: Diagnosis present

## 2016-02-03 DIAGNOSIS — Z3A30 30 weeks gestation of pregnancy: Secondary | ICD-10-CM | POA: Insufficient documentation

## 2016-02-03 DIAGNOSIS — N3090 Cystitis, unspecified without hematuria: Secondary | ICD-10-CM

## 2016-02-03 DIAGNOSIS — O26893 Other specified pregnancy related conditions, third trimester: Secondary | ICD-10-CM | POA: Insufficient documentation

## 2016-02-03 DIAGNOSIS — F17210 Nicotine dependence, cigarettes, uncomplicated: Secondary | ICD-10-CM | POA: Diagnosis not present

## 2016-02-03 DIAGNOSIS — O2313 Infections of bladder in pregnancy, third trimester: Secondary | ICD-10-CM | POA: Diagnosis not present

## 2016-02-03 DIAGNOSIS — O99333 Smoking (tobacco) complicating pregnancy, third trimester: Secondary | ICD-10-CM | POA: Diagnosis not present

## 2016-02-03 LAB — URINALYSIS COMPLETE WITH MICROSCOPIC (ARMC ONLY)
GLUCOSE, UA: NEGATIVE mg/dL
Nitrite: NEGATIVE
Protein, ur: 30 mg/dL — AB
Specific Gravity, Urine: 1.025 (ref 1.005–1.030)
pH: 6.5 (ref 5.0–8.0)

## 2016-02-03 MED ORDER — AMOXICILLIN 500 MG PO CAPS: 500.0000 mg | ORAL_CAPSULE | Freq: Three times a day (TID) | ORAL | 0 refills | Status: DC

## 2016-02-03 NOTE — ED Provider Notes (Signed)
[facility]    CSN: [id number] Arrival date & time: 02/03/16  1347     History   Chief Complaint Chief Complaint  Patient presents with  . Dizziness    HPI Vicki Wiley is a 30 y.o. female.   30 yo female approximately [redacted] weeks pregnant presents with a c/o dizziness since leaving her OB appointment today. States had a regular OB follow up today and told everything was stable. After leaving appointment patient began feeling slightly dizzy. Denies fainting, palpitations, bleeding, fevers, chills, vomiting, diarrhea, vision changes, chest pains, shortness of breath. States has not been drinking much fluids and urine now very concentrated.    The history is provided by the patient.  Dizziness    Past Medical History:  Diagnosis Date  . Patient denies medical problems     There are no active problems to display for this patient.   Past Surgical History:  Procedure Laterality Date  . CESAREAN SECTION      OB History    Gravida Para Term Preterm AB Living   4         1   SAB TAB Ectopic Multiple Live Births                   Home Medications    Prior to Admission medications   Medication Sig Start Date End Date Taking? Authorizing Provider  Prenatal Vit-Fe Fumarate-FA (PRENATAL MULTIVITAMIN) TABS tablet Take 1 tablet by mouth daily at 12 noon.   Yes Historical Provider, MD  acetaminophen (TYLENOL) 500 MG tablet Take 2 tablets (1,000 mg total) by mouth every 6 (six) hours as needed. 12/14/15   Barbaraann Barthelina A Betancourt, NP  amoxicillin (AMOXIL) 500 MG capsule Take 1 capsule (500 mg total) by mouth 3 (three) times daily. 02/03/16   Payton Mccallumrlando Saira Kramme, MD    Family History History reviewed. No pertinent family history.  Social History Social History  Substance Use Topics  . Smoking status: Current Every Day Smoker    Types: Cigarettes  . Smokeless tobacco: Never Used  . Alcohol use No     Allergies   Review of patient's allergies indicates no known  allergies.   Review of Systems Review of Systems  Neurological: Positive for dizziness.     Physical Exam Triage Vital Signs ED Triage Vitals  Enc Vitals Group     BP 02/03/16 1408 117/62     Pulse Rate 02/03/16 1408 (!) 112     Resp 02/03/16 1408 16     Temp 02/03/16 1408 98 F (36.7 C)     Temp Source 02/03/16 1408 Oral     SpO2 02/03/16 1408 98 %     Weight 02/03/16 1409 249 lb (112.9 kg)     Height 02/03/16 1409 5\' 6"  (1.676 m)     Head Circumference --      Peak Flow --      Pain Score 02/03/16 1412 0     Pain Loc --      Pain Edu? --      Excl. in GC? --    No data found.   Updated Vital Signs BP 117/62 (BP Location: Left Arm)   Pulse (!) 112   Temp 98 F (36.7 C) (Oral)   Resp 16   Ht 5\' 6"  (1.676 m)   Wt 249 lb (112.9 kg)   LMP 07/09/2015   SpO2 98%   BMI 40.19 kg/m   Visual Acuity Right Eye Distance:  Left Eye Distance:   Bilateral Distance:    Right Eye Near:   Left Eye Near:    Bilateral Near:     Physical Exam  Constitutional: She appears well-developed and well-nourished. No distress.  HENT:  Head: Normocephalic and atraumatic.  Right Ear: Tympanic membrane, external ear and ear canal normal.  Left Ear: Tympanic membrane, external ear and ear canal normal.  Nose: No mucosal edema, rhinorrhea, nose lacerations, sinus tenderness, nasal deformity, septal deviation or nasal septal hematoma. No epistaxis.  No foreign bodies. Right sinus exhibits no maxillary sinus tenderness and no frontal sinus tenderness. Left sinus exhibits no maxillary sinus tenderness and no frontal sinus tenderness.  Mouth/Throat: Uvula is midline, oropharynx is clear and moist and mucous membranes are normal. No oropharyngeal exudate.  Eyes: Conjunctivae and EOM are normal. Pupils are equal, round, and reactive to light. Right eye exhibits no discharge. Left eye exhibits no discharge. No scleral icterus.  Neck: Normal range of motion. Neck supple. No thyromegaly present.    Cardiovascular: Normal rate, regular rhythm, normal heart sounds and intact distal pulses.   Pulmonary/Chest: Effort normal and breath sounds normal. No respiratory distress. She has no wheezes. She has no rales.  Abdominal: Soft. Bowel sounds are normal.  Lymphadenopathy:    She has no cervical adenopathy.  Skin: She is not diaphoretic.  Nursing note and vitals reviewed.    UC Treatments / Results  Labs (all labs ordered are listed, but only abnormal results are displayed) Labs Reviewed  URINALYSIS COMPLETEWITH MICROSCOPIC ([facility] ONLY) - Abnormal; Notable for the following:       Result Value   Color, Urine AMBER (*)    APPearance CLOUDY (*)    Bilirubin Urine SMALL (*)    Ketones, ur TRACE (*)    Hgb urine dipstick TRACE (*)    Protein, ur 30 (*)    Leukocytes, UA SMALL (*)    Bacteria, UA MANY (*)    Squamous Epithelial / LPF 0-5 (*)    All other components within normal limits  URINE CULTURE    EKG  EKG Interpretation None       Radiology No results found.  Procedures Procedures (including critical care time)  Medications Ordered in UC Medications - No data to display   Initial Impression / Assessment and Plan / UC Course  I have reviewed the triage vital signs and the nursing notes.  Pertinent labs & imaging results that were available during my care of the patient were reviewed by me and considered in my medical decision making (see chart for details).  Clinical Course      Final Clinical Impressions(s) / UC Diagnoses   Final diagnoses:  Cystitis    New Prescriptions Discharge Medication List as of 02/03/2016  3:41 PM    START taking these medications   Details  amoxicillin (AMOXIL) 500 MG capsule Take 1 capsule (500 mg total) by mouth 3 (three) times daily., Starting Fri 02/03/2016, Normal       1. Lab results and diagnosis reviewed with patient 2. rx as per orders above; reviewed possible side effects, interactions, risks and benefits   3. Recommend supportive treatment with increased water intake 4. Check urine culture 5. Follow-up prn if symptoms worsen or don't improve   Payton Mccallum, MD 02/03/16 1955

## 2016-02-03 NOTE — ED Triage Notes (Signed)
[facility] and started feeling dizzy after leaving her appointment. Patient reports feeling dehydrated and has been drinking water. Read doctors notes from earlier today.

## 2016-02-06 LAB — URINE CULTURE

## 2016-03-04 ENCOUNTER — Inpatient Hospital Stay
Admission: EM | Admit: 2016-03-04 | Discharge: 2016-03-04 | DRG: 781 | Disposition: A | Discharge status: Home / Self Care | Payer: Medicaid Other | Attending: Obstetrics and Gynecology | Admitting: Obstetrics and Gynecology

## 2016-03-04 DIAGNOSIS — K5900 Constipation, unspecified: Secondary | ICD-10-CM | POA: Diagnosis present

## 2016-03-04 DIAGNOSIS — Z79899 Other long term (current) drug therapy: Secondary | ICD-10-CM

## 2016-03-04 DIAGNOSIS — Z3A34 34 weeks gestation of pregnancy: Secondary | ICD-10-CM | POA: Diagnosis not present

## 2016-03-04 DIAGNOSIS — M545 Low back pain: Secondary | ICD-10-CM | POA: Diagnosis present

## 2016-03-04 DIAGNOSIS — O26893 Other specified pregnancy related conditions, third trimester: Secondary | ICD-10-CM | POA: Diagnosis present

## 2016-03-04 DIAGNOSIS — R05 Cough: Secondary | ICD-10-CM | POA: Diagnosis present

## 2016-03-04 DIAGNOSIS — Z98891 History of uterine scar from previous surgery: Secondary | ICD-10-CM | POA: Diagnosis not present

## 2016-03-04 DIAGNOSIS — R109 Unspecified abdominal pain: Secondary | ICD-10-CM

## 2016-03-04 LAB — URINALYSIS COMPLETE WITH MICROSCOPIC (ARMC ONLY)
Bilirubin Urine: NEGATIVE
Glucose, UA: NEGATIVE mg/dL
HGB URINE DIPSTICK: NEGATIVE
KETONES UR: NEGATIVE mg/dL
NITRITE: NEGATIVE
PH: 6 (ref 5.0–8.0)
PROTEIN: NEGATIVE mg/dL
SPECIFIC GRAVITY, URINE: 1.017 (ref 1.005–1.030)

## 2016-03-04 MED ORDER — ACETAMINOPHEN 500 MG PO TABS: 1000.0000 mg | ORAL_TABLET | Freq: Four times a day (QID) | ORAL | Status: DC | PRN

## 2016-03-04 MED ORDER — FLEET ENEMA 7-19 GM/118ML RE ENEM
1.0000 | ENEMA | Freq: Once | RECTAL | Status: AC
  Administered 2016-03-04: 1 via RECTAL

## 2016-03-04 NOTE — Final Progress Note (Signed)
L&D OB Triage Note  HPI:  Vicki Wiley is an unassigned ([facility])  30 y.o. [phone] female at 103w1d,  Estimated Date of Delivery: 04/14/16 who presents with complaints of abdominal pain and low back pain.  Abdominal pain has been ongoing for several days, but low back pain began this morning.  Has not taken anything or pain.  Of note, patient with h/o C-section x 2.     OB History  Gravida Para Term Preterm AB Living  5       2 1   SAB TAB Ectopic Multiple Live Births    2          # Outcome Date GA Lbr Len/2nd Weight Sex Delivery Anes PTL Lv  5 Current           4 TAB 2015          3 TAB 2012          2 Gravida 07/17/08     CS-Unspec     1 Gravida 2009     CS-Unspec         Patient Active Problem List   Diagnosis Date Noted  . Abdominal pain 03/04/2016    Past Medical History:  Diagnosis Date  . Patient denies medical problems     No current facility-administered medications on file prior to encounter.    Current Outpatient Prescriptions on File Prior to Encounter  Medication Sig Dispense Refill  . Prenatal Vit-Fe Fumarate-FA (PRENATAL MULTIVITAMIN) TABS tablet Take 1 tablet by mouth daily at 12 noon.    Marland Kitchen. acetaminophen (TYLENOL) 500 MG tablet Take 2 tablets (1,000 mg total) by mouth every 6 (six) hours as needed. (Patient not taking: Reported on 03/04/2016) 30 tablet 0  . amoxicillin (AMOXIL) 500 MG capsule Take 1 capsule (500 mg total) by mouth 3 (three) times daily. (Patient not taking: Reported on 03/04/2016) 21 capsule 0    No Known Allergies   ROS:  Review of Systems - Respiratory ROS: positive for - cough, pleuritic pain and wheezing negative for - hemoptysis or shortness of breath Gastrointestinal ROS: positive for - abdominal pain and constipation (reports no BM in 5 days) negative for - blood in stools or nausea/vomiting   Physical Exam:  Temperature 98 F (36.7 C), temperature source Oral, resp. rate (!) 24, height 5\' 6"  (1.676 m),  weight 248 lb (112.5 kg), last menstrual period 07/09/2015, SpO2 98 %. General appearance: alert and mild distress Abdomen: normal findings: soft, gravid, bowel sounds normal and abnormal findings:  mildly tender along upper abdomen and costal regions Pelvic: cervix long/thick/closed Extremities: extremities normal, atraumatic, no cyanosis or edema   NST INTERPRETATION: Indications: rule out uterine contractions  Mode: External Baseline Rate (A): 140 bpm Variability: Moderate Accelerations: 15 x 15 Decelerations: None     Contraction Frequency (min): 0  Impression: reactive   Labs:  Results for orders placed or performed during the hospital encounter of 03/04/16  Urinalysis complete, with microscopic ([facility] only)  Result Value Ref Range   Color, Urine YELLOW (A) YELLOW   APPearance CLEAR (A) CLEAR   Glucose, UA NEGATIVE NEGATIVE mg/dL   Bilirubin Urine NEGATIVE NEGATIVE   Ketones, ur NEGATIVE NEGATIVE mg/dL   Specific Gravity, Urine 1.017 1.005 - 1.030   Hgb urine dipstick NEGATIVE NEGATIVE   pH 6.0 5.0 - 8.0   Protein, ur NEGATIVE NEGATIVE mg/dL   Nitrite NEGATIVE NEGATIVE   Leukocytes, UA TRACE (A) NEGATIVE   RBC /  HPF 0-5 0 - 5 RBC/hpf   WBC, UA 0-5 0 - 5 WBC/hpf   Bacteria, UA FEW (A) NONE SEEN   Squamous Epithelial / LPF 6-30 (A) NONE SEEN   Mucous PRESENT      Assessment:  30 y.o. [id number] at 6152w1d with:  1.  Abdominal pain (likely musculoskeletal), possibly secondary to constipation 2. Constipation 3. H/o prior C-section x 2    Plan:  1. Abdominal pain (mostly upper abdomen and along costal margins) likely secondary to excessive coughing as patient has had URI symptoms for ~ 5 days.  Has only started taking medication today (Sudafed).  Also recommend Mucinex, nasal saline spray, and/or Robitussin for symptoms.  2. Constipation - patient has not had a BM in 5 days.  Administered fleet's enema with successful BM.  Advised on increasing water intake (as  patient notes she does not consume much of it), as well as high fiber diet. Can use OTC stool softeners as needed.  3.Pregnancy at 32 weeks with h/o prior C_section x 2. Ruled out or labor.  GIven PTL precautions.  [facility]  [facility].   Hildred LaserAnika Tyrick Dunagan, MD [facility]

## 2016-03-04 NOTE — Progress Notes (Signed)
Pt had successful bowel mov't and reports feeling "much better" and denies any more pelvic pain or rectal pressure.  Ready to go home.

## 2016-08-04 ENCOUNTER — Encounter: Payer: Self-pay | Admitting: Medical Oncology

## 2016-08-04 ENCOUNTER — Emergency Department
Admission: EM | Admit: 2016-08-04 | Discharge: 2016-08-04 | Disposition: A | Discharge status: Home / Self Care | Payer: Self-pay | Attending: Emergency Medicine | Admitting: Emergency Medicine

## 2016-08-04 DIAGNOSIS — N760 Acute vaginitis: Secondary | ICD-10-CM | POA: Insufficient documentation

## 2016-08-04 DIAGNOSIS — B9689 Other specified bacterial agents as the cause of diseases classified elsewhere: Secondary | ICD-10-CM

## 2016-08-04 DIAGNOSIS — F17210 Nicotine dependence, cigarettes, uncomplicated: Secondary | ICD-10-CM | POA: Insufficient documentation

## 2016-08-04 DIAGNOSIS — Z79899 Other long term (current) drug therapy: Secondary | ICD-10-CM | POA: Insufficient documentation

## 2016-08-04 LAB — CHLAMYDIA/NGC RT PCR (ARMC ONLY)
Chlamydia Tr: NOT DETECTED
N GONORRHOEAE: NOT DETECTED

## 2016-08-04 LAB — WET PREP, GENITAL
Sperm: NONE SEEN
Trich, Wet Prep: NONE SEEN
Yeast Wet Prep HPF POC: NONE SEEN

## 2016-08-04 MED ORDER — METRONIDAZOLE 500 MG PO TABS: 500.0000 mg | ORAL_TABLET | Freq: Two times a day (BID) | ORAL | 0 refills | Status: DC

## 2016-08-04 NOTE — ED Provider Notes (Signed)
[facility] Emergency Department Provider Note  ____________________________________________  Time seen: Approximately 6:15 PM  I have reviewed the triage vital signs and the nursing notes.   HISTORY  Chief Complaint Vaginal Discharge    HPI SACHI BOULAY is a 31 y.o. female who presents to the emergency department for evaluation of vaginal discharge similar to previous trichomonas infections. Vaginal discharge started 3 days ago. Likely exposed by her child's father per patient. No alleviating measures attempted prior to arrival.  Past Medical History:  Diagnosis Date  . Patient denies medical problems     Patient Active Problem List   Diagnosis Date Noted  . Abdominal pain 03/04/2016    Past Surgical History:  Procedure Laterality Date  . CESAREAN SECTION      Prior to Admission medications   Medication Sig Start Date End Date Taking? Authorizing Provider  acetaminophen (TYLENOL) 500 MG tablet Take 2 tablets (1,000 mg total) by mouth every 6 (six) hours as needed. Patient not taking: Reported on 03/04/2016 12/14/15   Barbaraann Barthel, NP  amoxicillin (AMOXIL) 500 MG capsule Take 1 capsule (500 mg total) by mouth 3 (three) times daily. Patient not taking: Reported on 03/04/2016 02/03/16   Payton Mccallum, MD  metroNIDAZOLE (FLAGYL) 500 MG tablet Take 1 tablet (500 mg total) by mouth 2 (two) times daily. 08/04/16   Chinita Pester, FNP  Prenatal Vit-Fe Fumarate-FA (PRENATAL MULTIVITAMIN) TABS tablet Take 1 tablet by mouth daily at 12 noon.    Historical Provider, MD    Allergies Patient has no known allergies.  History reviewed. No pertinent family history.  Social History Social History  Substance Use Topics  . Smoking status: Current Every Day Smoker    Packs/day: 0.25    Years: 6.00    Types: Cigarettes  . Smokeless tobacco: Never Used  . Alcohol use No    Review of Systems Constitutional: Negative for fever. Respiratory: Negative  for shortness of breath or cough. Gastrointestinal: Negative for abdominal pain; negative for nausea , negative for vomiting. Genitourinary: Negative for dysuria , Positive for vaginal discharge. Musculoskeletal: Negative for back pain. Skin: Negative for rash, lesion, or wound. ____________________________________________   PHYSICAL EXAM:  VITAL SIGNS: ED Triage Vitals  Enc Vitals Group     BP 08/04/16 1741 132/90     Pulse Rate 08/04/16 1741 98     Resp 08/04/16 1741 16     Temp 08/04/16 1741 97.8 F (36.6 C)     Temp Source 08/04/16 1741 Oral     SpO2 08/04/16 1741 97 %     Weight 08/04/16 1735 216 lb (98 kg)     Height 08/04/16 1735  (1.676 m)     Head Circumference --      Peak Flow --      Pain Score 08/04/16 1735 8     Pain Loc --      Pain Edu? --      Excl. in GC? --     Constitutional: Alert and oriented. Well appearing and in no acute distress. Eyes: Conjunctivae are normal. PERRL. EOMI. Head: Atraumatic. Nose: No congestion/rhinnorhea. Mouth/Throat: Mucous membranes are moist. Respiratory: Normal respiratory effort.  No retractions. Gastrointestinal: Soft, nontender, no rebound or guarding. Genitourinary: Pelvic exam: IUD strings identified. Greyish, thin discharge present. Cervical os is closed.  Musculoskeletal: No extremity tenderness nor edema.  Neurologic:  Normal speech and language. No gross focal neurologic deficits are appreciated. Speech is normal. No gait instability. Skin:  Skin is warm, dry and intact. No rash noted. Psychiatric: Mood and affect are normal. Speech and behavior are normal.  ____________________________________________   LABS (all labs ordered are listed, but only abnormal results are displayed)  Labs Reviewed  WET PREP, GENITAL - Abnormal; Notable for the following:       Result Value   Clue Cells Wet Prep HPF POC PRESENT (*)    WBC, Wet Prep HPF POC FEW (*)    All other components within normal limits  CHLAMYDIA/NGC  RT PCR ([facility] ONLY)   ____________________________________________  RADIOLOGY  Not indicated. ____________________________________________   PROCEDURES  Procedure(s) performed: None  ____________________________________________  31 year old female presenting to the ER for evaluation of vaginal discharge. She will be treated for vaginitis and take flagyl for the next week. She was instructed to follow up with her primary care provider for symptoms that have not improved after finishing the medication. She was instructed to return to the ER for symptoms that change or worsen if unable to schedule an appointment.  INITIAL IMPRESSION / ASSESSMENT AND PLAN / ED COURSE  Pertinent labs & imaging results that were available during my care of the patient were reviewed by me and considered in my medical decision making (see chart for details).  ____________________________________________   FINAL CLINICAL IMPRESSION(S) / ED DIAGNOSES  Final diagnoses:  BV (bacterial vaginosis)    Note:  This document was prepared using Dragon voice recognition software and may include unintentional dictation errors.    Chinita Pester, FNP 08/04/16 1943    Sharman Cheek, MD 08/04/16 [phone]

## 2016-08-04 NOTE — Discharge Instructions (Signed)
Follow up with your primary care provider if you don't feel your symptoms have completely resolved after finishing your medication. Return to the ER for symptoms that change or worsen if you are unable to schedule an appointment.

## 2016-08-04 NOTE — ED Triage Notes (Signed)
Pt to ed with c/o pain and itching in vaginal area x 2 days.

## 2018-06-30 ENCOUNTER — Encounter: Payer: Self-pay | Admitting: Emergency Medicine

## 2018-06-30 ENCOUNTER — Other Ambulatory Visit: Payer: Self-pay

## 2018-06-30 ENCOUNTER — Emergency Department: Payer: Medicaid Other

## 2018-06-30 ENCOUNTER — Emergency Department
Admission: EM | Admit: 2018-06-30 | Discharge: 2018-06-30 | Disposition: A | Discharge status: Home / Self Care | Payer: Medicaid Other | Attending: Emergency Medicine | Admitting: Emergency Medicine

## 2018-06-30 DIAGNOSIS — O99512 Diseases of the respiratory system complicating pregnancy, second trimester: Secondary | ICD-10-CM | POA: Insufficient documentation

## 2018-06-30 DIAGNOSIS — F17210 Nicotine dependence, cigarettes, uncomplicated: Secondary | ICD-10-CM | POA: Insufficient documentation

## 2018-06-30 DIAGNOSIS — J069 Acute upper respiratory infection, unspecified: Secondary | ICD-10-CM | POA: Insufficient documentation

## 2018-06-30 DIAGNOSIS — Z79899 Other long term (current) drug therapy: Secondary | ICD-10-CM | POA: Insufficient documentation

## 2018-06-30 DIAGNOSIS — Z3A13 13 weeks gestation of pregnancy: Secondary | ICD-10-CM | POA: Insufficient documentation

## 2018-06-30 DIAGNOSIS — R05 Cough: Secondary | ICD-10-CM | POA: Diagnosis present

## 2018-06-30 DIAGNOSIS — O99332 Smoking (tobacco) complicating pregnancy, second trimester: Secondary | ICD-10-CM | POA: Insufficient documentation

## 2018-06-30 MED ORDER — AZITHROMYCIN 500 MG PO TABS
500.0000 mg | ORAL_TABLET | Freq: Once | ORAL | Status: AC
Start: 2018-06-30 — End: 2018-06-30
  Administered 2018-06-30: 500 mg via ORAL
  Filled 2018-06-30: qty 1

## 2018-06-30 MED ORDER — AZITHROMYCIN 250 MG PO TABS: ORAL_TABLET | ORAL | 0 refills | Status: DC

## 2018-06-30 MED ORDER — ALBUTEROL SULFATE (2.5 MG/3ML) 0.083% IN NEBU
2.5000 mg | INHALATION_SOLUTION | Freq: Once | RESPIRATORY_TRACT | Status: AC
  Administered 2018-06-30: 2.5 mg via RESPIRATORY_TRACT
  Filled 2018-06-30: qty 3

## 2018-06-30 MED ORDER — ACETAMINOPHEN 325 MG PO TABS
650.0000 mg | ORAL_TABLET | Freq: Once | ORAL | Status: AC
  Administered 2018-06-30: 650 mg via ORAL
  Filled 2018-06-30: qty 2

## 2018-06-30 NOTE — Discharge Instructions (Signed)
You were given a prescription for antibiotics to cover for a bacterial infection.  You can take Tylenol for symptoms.  Please return the emergency department immediately for any shortness of breath, abdominal cramping/pain, vaginal bleeding.  Please follow-up with your OB on Thursday.

## 2018-06-30 NOTE — ED Provider Notes (Signed)
[facility] Emergency Department Provider Note  ____________________________________________  Time seen: Approximately 10:18 PM  I have reviewed the triage vital signs and the nursing notes.   HISTORY  Chief Complaint Cough    HPI Vicki Wiley is a 33 y.o. female that is [redacted] weeks pregnant presents emergency department for evaluation of nasal congestion and productive cough with green sputum for 6 days.  She has had some chest discomfort, only with coughing. Her nose is congested and she is having difficulty breathing through her nose. She does not have and SOB in her chest. Patient went to the emergency department on 3/12 and was diagnosed with a viral URI. Her flu and strep tests were negative. Patient had some vaginal spotting and back discomfort early morning on 3/14 and went to [facility] Saturday afternoon and had an ultrasound, with showed an intrauterine pregnancy with fetal heart beat 140.  She has not had any vaginal spotting or abdominal cramping since.  She has an appointment with her OB on Thursday.  No recent travel.  No contacts with coronavirus.  Past Medical History:  Diagnosis Date  . Patient denies medical problems     Patient Active Problem List   Diagnosis Date Noted  . Abdominal pain 03/04/2016    Past Surgical History:  Procedure Laterality Date  . CESAREAN SECTION      Prior to Admission medications   Medication Sig Start Date End Date Taking? Authorizing Provider  acetaminophen (TYLENOL) 500 MG tablet Take 2 tablets (1,000 mg total) by mouth every 6 (six) hours as needed. Patient not taking: Reported on 03/04/2016 12/14/15   Betancourt, Jarold Song, NP  amoxicillin (AMOXIL) 500 MG capsule Take 1 capsule (500 mg total) by mouth 3 (three) times daily. Patient not taking: Reported on 03/04/2016 02/03/16   Payton Mccallum, MD  azithromycin (ZITHROMAX Z-PAK) 250 MG tablet Take 2 tablets (500 mg) on  Day 1,  followed by 1 tablet (250 mg)  once daily on Days 2 through 5. 06/30/18   Enid Derry, PA-C  metroNIDAZOLE (FLAGYL) 500 MG tablet Take 1 tablet (500 mg total) by mouth 2 (two) times daily. 08/04/16   Triplett, Kasandra Knudsen, FNP  Prenatal Vit-Fe Fumarate-FA (PRENATAL MULTIVITAMIN) TABS tablet Take 1 tablet by mouth daily at 12 noon.    [provider]    Allergies Patient has no known allergies.  No family history on file.  Social History Social History   Tobacco Use  . Smoking status: Current Every Day Smoker    Packs/day: 0.25    Years: 6.00    Pack years: 1.50    Types: Cigarettes  . Smokeless tobacco: Never Used  Substance Use Topics  . Alcohol use: No  . Drug use: No     Review of Systems  Constitutional: No fever/chills Eyes: No visual changes. No discharge. ENT: Positive for congestion and rhinorrhea. Cardiovascular: No chest pain. Respiratory: Positive for cough. No SOB. Gastrointestinal: No abdominal pain.  No nausea, no vomiting.  No diarrhea.  No constipation. Musculoskeletal: Negative for musculoskeletal pain. Skin: Negative for rash, abrasions, lacerations, ecchymosis. Neurological: Negative for headaches.   ____________________________________________   PHYSICAL EXAM:  VITAL SIGNS: ED Triage Vitals  Enc Vitals Group     BP 06/30/18 2056 132/71     Pulse Rate 06/30/18 2056 (!) 104     Resp 06/30/18 2056 20     Temp 06/30/18 2056 99.6 F (37.6 C)     Temp Source 06/30/18 2056 Oral  SpO2 06/30/18 2056 99 %     Weight 06/30/18 2055 269 lb (122 kg)     Height 06/30/18 2055  (1.676 m)     Head Circumference --      Peak Flow --      Pain Score 06/30/18 2055 0     Pain Loc --      Pain Edu? --      Excl. in GC? --      Constitutional: Alert and oriented. Well appearing and in no acute distress. Eyes: Conjunctivae are normal. PERRL. EOMI. No discharge. Head: Atraumatic. ENT: No frontal and maxillary sinus tenderness.      Ears: Tympanic membranes pearly gray with  good landmarks. No discharge.      Nose: Mild congestion/rhinnorhea.      Mouth/Throat: Mucous membranes are moist. Oropharynx non-erythematous. Tonsils not enlarged. No exudates. Uvula midline. Neck: No stridor.   Hematological/Lymphatic/Immunilogical: No cervical lymphadenopathy. Cardiovascular: Normal rate, regular rhythm.  Good peripheral circulation. Respiratory: Normal respiratory effort without tachypnea or retractions. Lungs CTAB. Good air entry to the bases with no decreased or absent breath sounds. Gastrointestinal: Bowel sounds 4 quadrants. Soft and nontender to palpation. No guarding or rigidity. No palpable masses. No distention. Musculoskeletal: Full range of motion to all extremities. No gross deformities appreciated. Neurologic:  Normal speech and language. No gross focal neurologic deficits are appreciated.  Skin:  Skin is warm, dry and intact. No rash noted. Psychiatric: Mood and affect are normal. Speech and behavior are normal. Patient exhibits appropriate insight and judgement.   ____________________________________________   LABS (all labs ordered are listed, but only abnormal results are displayed)  Labs Reviewed - No data to display ____________________________________________  EKG   ____________________________________________  RADIOLOGY   No results found.  ____________________________________________    PROCEDURES  Procedure(s) performed:    Procedures    Medications  azithromycin (ZITHROMAX) tablet 500 mg (500 mg Oral Given 06/30/18 2303)  acetaminophen (TYLENOL) tablet 650 mg (650 mg Oral Given 06/30/18 2303)  albuterol (PROVENTIL) (2.5 MG/3ML) 0.083% nebulizer solution 2.5 mg (2.5 mg Nebulization Given 06/30/18 2303)     ____________________________________________   INITIAL IMPRESSION / ASSESSMENT AND PLAN / ED COURSE  Pertinent labs & imaging results that were available during my care of the patient were reviewed by me and  considered in my medical decision making (see chart for details).  Review of the [place] CSRS was performed in accordance of the [facility] prior to dispensing any controlled drugs.   Patient's diagnosis is consistent with URI. Vital signs and exam are reassuring.  Symptoms have been present for 6 days so patient will be covered for bacterial infection with azithromycin. Fetal heart tones checked by RN. Patient appears well and is staying well hydrated. Patient feels comfortable going home. Patient will be discharged home with prescriptions for azithromycin. Patient is to follow up with ob/gyn as needed or otherwise directed. Patient is given ED precautions to return to the ED for any worsening or new symptoms.     ____________________________________________  FINAL CLINICAL IMPRESSION(S) / ED DIAGNOSES  Final diagnoses:  Upper respiratory tract infection, unspecified type      NEW MEDICATIONS STARTED DURING THIS VISIT:  ED Discharge Orders         Ordered    azithromycin (ZITHROMAX Z-PAK) 250 MG tablet     06/30/18 2319              This chart was dictated using voice recognition software/Dragon. Despite best efforts  to proofread, errors can occur which can change the meaning. Any change was purely unintentional.    Enid Derry, PA-C 06/30/18 2344    Arnaldo Natal, MD 07/01/18 [phone]

## 2018-06-30 NOTE — ED Triage Notes (Signed)
Patient ambulatory to triage with steady gait, without difficulty or distress noted; pt reports sinus congestion, prod cough green sputum since Wednesday

## 2019-02-20 ENCOUNTER — Other Ambulatory Visit: Payer: Self-pay

## 2019-02-20 DIAGNOSIS — Z20822 Contact with and (suspected) exposure to covid-19: Secondary | ICD-10-CM

## 2019-02-21 LAB — NOVEL CORONAVIRUS, NAA: SARS-CoV-2, NAA: NOT DETECTED

## 2019-09-07 ENCOUNTER — Ambulatory Visit (INDEPENDENT_AMBULATORY_CARE_PROVIDER_SITE_OTHER): Payer: Medicaid Other

## 2019-09-07 ENCOUNTER — Ambulatory Visit
Admission: EM | Admit: 2019-09-07 | Discharge: 2019-09-07 | Disposition: A | Discharge status: Home / Self Care | Payer: Medicaid Other | Attending: Family Medicine | Admitting: Family Medicine

## 2019-09-07 ENCOUNTER — Encounter: Payer: Self-pay | Admitting: Emergency Medicine

## 2019-09-07 ENCOUNTER — Other Ambulatory Visit: Payer: Self-pay

## 2019-09-07 DIAGNOSIS — S92522A Displaced fracture of medial phalanx of left lesser toe(s), initial encounter for closed fracture: Secondary | ICD-10-CM

## 2019-09-07 DIAGNOSIS — S0003XA Contusion of scalp, initial encounter: Secondary | ICD-10-CM | POA: Diagnosis not present

## 2019-09-07 DIAGNOSIS — S29012A Strain of muscle and tendon of back wall of thorax, initial encounter: Secondary | ICD-10-CM

## 2019-09-07 DIAGNOSIS — S161XXA Strain of muscle, fascia and tendon at neck level, initial encounter: Secondary | ICD-10-CM

## 2019-09-07 DIAGNOSIS — S39012A Strain of muscle, fascia and tendon of lower back, initial encounter: Secondary | ICD-10-CM

## 2019-09-07 MED ORDER — CYCLOBENZAPRINE HCL 10 MG PO TABS: 10.0000 mg | ORAL_TABLET | Freq: Every day | ORAL | 0 refills | Status: DC

## 2019-09-07 MED ORDER — HYDROCODONE-ACETAMINOPHEN 5-325 MG PO TABS: ORAL_TABLET | ORAL | 0 refills | Status: DC
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
## 2019-09-07 NOTE — Discharge Instructions (Signed)
Buddy tape and post op shoe Over the counter tylenol/advil as needed

## 2020-03-23 ENCOUNTER — Other Ambulatory Visit: Payer: Self-pay

## 2020-03-23 ENCOUNTER — Emergency Department
Admission: EM | Admit: 2020-03-23 | Discharge: 2020-03-23 | Discharge status: Against Medical Advice | Payer: Medicaid Other

## 2020-03-23 ENCOUNTER — Ambulatory Visit
Admission: EM | Admit: 2020-03-23 | Discharge: 2020-03-23 | Disposition: A | Discharge status: Home / Self Care | Payer: Medicaid Other | Attending: Family Medicine | Admitting: Family Medicine

## 2020-03-23 DIAGNOSIS — R1031 Right lower quadrant pain: Secondary | ICD-10-CM | POA: Diagnosis not present

## 2020-03-23 LAB — URINALYSIS, COMPLETE (UACMP) WITH MICROSCOPIC
Bilirubin Urine: NEGATIVE
Glucose, UA: NEGATIVE mg/dL
Hgb urine dipstick: NEGATIVE
Ketones, ur: NEGATIVE mg/dL
Leukocytes,Ua: NEGATIVE
Nitrite: NEGATIVE
Specific Gravity, Urine: 1.025 (ref 1.005–1.030)
pH: 6 (ref 5.0–8.0)

## 2020-03-23 LAB — PREGNANCY, URINE: Preg Test, Ur: NEGATIVE

## 2020-03-23 NOTE — ED Triage Notes (Signed)
Patient complains of right lower quadrant pain that started yesterday but worsened this morning. States that she has a known hernia in this area but is unsure if this is related. Patient states that pain radiates all across lower abdomen. Reports that she has been nausea.

## 2020-03-23 NOTE — ED Provider Notes (Signed)
[facility]    CSN: [id number] Arrival date & time: 03/23/20  1112      History   Chief Complaint Chief Complaint  Patient presents with  . Abdominal Pain    RLQ    HPI Vicki Wiley is a 34 y.o. female.   HPI   34 year old female here for evaluation of right lower quadrant abdominal pain nausea, fever, and anorexia.  Patient reports that her symptoms started yesterday they became worse today.  Patient denies urinary complaints, vaginal discharge, vaginal itching, or pain.  Past Medical History:  Diagnosis Date  . Patient denies medical problems     Patient Active Problem List   Diagnosis Date Noted  . Abdominal pain 03/04/2016    Past Surgical History:  Procedure Laterality Date  . CESAREAN SECTION      OB History    Gravida  5   Para      Term      Preterm      AB  2   Living  1     SAB      TAB  2   Ectopic      Multiple      Live Births               Home Medications    Prior to Admission medications   Not on File    Family History Family History  Problem Relation Age of Onset  . COPD Mother   . Hypertension Father     Social History Social History   Tobacco Use  . Smoking status: Current Every Day Smoker    Packs/day: 0.25    Years: 6.00    Pack years: 1.50    Types: Cigarettes  . Smokeless tobacco: Never Used  Vaping Use  . Vaping Use: Never used  Substance Use Topics  . Alcohol use: No  . Drug use: No     Allergies   Other   Review of Systems Review of Systems  Constitutional: Positive for appetite change and fever. Negative for activity change.  Gastrointestinal: Positive for abdominal pain, nausea and vomiting. Negative for diarrhea.  Genitourinary: Negative for dysuria, frequency, hematuria, urgency, vaginal bleeding, vaginal discharge and vaginal pain.  Musculoskeletal: Negative for back pain.  Skin: Negative for rash.  Hematological: Negative.   Psychiatric/Behavioral: Negative.       Physical Exam Triage Vital Signs ED Triage Vitals  Enc Vitals Group     BP 03/23/20 1232 122/83     Pulse Rate 03/23/20 1232 86     Resp 03/23/20 1232 17     Temp 03/23/20 1232 98.3 F (36.8 C)     Temp Source 03/23/20 1232 Oral     SpO2 03/23/20 1232 100 %     Weight 03/23/20 1230 240 lb (108.9 kg)     Height 03/23/20 1230 5\' 5"  (1.651 m)     Head Circumference --      Peak Flow --      Pain Score 03/23/20 1230 10     Pain Loc --      Pain Edu? --      Excl. in GC? --    No data found.  Updated Vital Signs BP 122/83 (BP Location: Right Arm)   Pulse 86   Temp 98.3 F (36.8 C) (Oral)   Resp 17   Ht 5\' 5"  (1.651 m)   Wt 240 lb (108.9 kg)   LMP 02/15/2020   SpO2 100%  BMI 39.94 kg/m   Visual Acuity Right Eye Distance:   Left Eye Distance:   Bilateral Distance:    Right Eye Near:   Left Eye Near:    Bilateral Near:     Physical Exam Vitals and nursing note reviewed.  Constitutional:      General: She is not in acute distress.    Appearance: She is well-developed. She is obese. She is ill-appearing.  HENT:     Head: Normocephalic and atraumatic.  Cardiovascular:     Rate and Rhythm: Normal rate and regular rhythm.     Heart sounds: Normal heart sounds. No murmur heard.  No gallop.   Pulmonary:     Effort: Pulmonary effort is normal.     Breath sounds: Normal breath sounds. No wheezing, rhonchi or rales.  Abdominal:     General: Abdomen is protuberant. Bowel sounds are normal. There is no distension.     Palpations: Abdomen is soft. There is no hepatomegaly or splenomegaly.     Tenderness: There is abdominal tenderness in the right lower quadrant. Positive signs include McBurney's sign.     Hernia: No hernia is present.  Skin:    General: Skin is warm and dry.     Capillary Refill: Capillary refill takes less than 2 seconds.     Findings: No erythema or rash.  Neurological:     General: No focal deficit present.     Mental Status: She is alert  and oriented to person, place, and time.  Psychiatric:        Mood and Affect: Mood normal.        Behavior: Behavior normal.      UC Treatments / Results  Labs (all labs ordered are listed, but only abnormal results are displayed) Labs Reviewed  URINALYSIS, COMPLETE (UACMP) WITH MICROSCOPIC  PREGNANCY, URINE    EKG   Radiology No results found.  Procedures Procedures (including critical care time)  Medications Ordered in UC Medications - No data to display  Initial Impression / Assessment and Plan / UC Course  I have reviewed the triage vital signs and the nursing notes.  Pertinent labs & imaging results that were available during my care of the patient were reviewed by me and considered in my medical decision making (see chart for details).   Patient is here for evaluation of right lower quad abdominal pain nausea and vomiting that started last night.  Patient states that she started running fever today.  She is also decreased appetite.  Physical exam reveals tenderness without guarding or rebound in the right lower quadrant.  Patient is tender over McBurney's point.  Physical exam concerning for appendicitis.  Patient's only past surgical history of cesarean section.  Will send patient to [facility] emergency department for appendicitis evaluation.   Final Clinical Impressions(s) / UC Diagnoses   Final diagnoses:  RLQ abdominal pain     Discharge Instructions     Please go to the ER at [facility] regional for evaluation for possible appendicitis.    ED Prescriptions    None     PDMP not reviewed this encounter.   Becky Augusta, NP 03/23/20 1255

## 2020-03-23 NOTE — Discharge Instructions (Addendum)
Please go to the ER at [facility] regional for evaluation for possible appendicitis.
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
## 2021-02-10 ENCOUNTER — Ambulatory Visit: Payer: Medicaid Other | Attending: Neurology

## 2021-02-10 ENCOUNTER — Other Ambulatory Visit: Admission: RE | Admit: 2021-02-10 | Payer: Medicaid Other | Source: Ambulatory Visit

## 2021-02-10 DIAGNOSIS — Z6841 Body Mass Index (BMI) 40.0 and over, adult: Secondary | ICD-10-CM | POA: Diagnosis not present

## 2021-02-10 DIAGNOSIS — Z9189 Other specified personal risk factors, not elsewhere classified: Secondary | ICD-10-CM | POA: Diagnosis not present

## 2021-02-10 DIAGNOSIS — R0902 Hypoxemia: Secondary | ICD-10-CM | POA: Insufficient documentation

## 2021-02-10 DIAGNOSIS — G4733 Obstructive sleep apnea (adult) (pediatric): Secondary | ICD-10-CM | POA: Diagnosis present

## 2021-02-10 DIAGNOSIS — R400 Somnolence: Secondary | ICD-10-CM | POA: Insufficient documentation

## 2021-02-10 DIAGNOSIS — F419 Anxiety disorder, unspecified: Secondary | ICD-10-CM | POA: Insufficient documentation

## 2021-02-12 ENCOUNTER — Other Ambulatory Visit: Payer: Self-pay

## 2021-02-12 ENCOUNTER — Ambulatory Visit
Admission: EM | Admit: 2021-02-12 | Discharge: 2021-02-12 | Disposition: A | Discharge status: Home / Self Care | Payer: Medicaid Other | Attending: Medical Oncology | Admitting: Medical Oncology

## 2021-02-12 ENCOUNTER — Encounter: Payer: Self-pay | Admitting: Emergency Medicine

## 2021-02-12 DIAGNOSIS — R059 Cough, unspecified: Secondary | ICD-10-CM | POA: Diagnosis not present

## 2021-02-12 DIAGNOSIS — R509 Fever, unspecified: Secondary | ICD-10-CM | POA: Diagnosis not present

## 2021-02-12 DIAGNOSIS — F17210 Nicotine dependence, cigarettes, uncomplicated: Secondary | ICD-10-CM | POA: Insufficient documentation

## 2021-02-12 DIAGNOSIS — R52 Pain, unspecified: Secondary | ICD-10-CM | POA: Diagnosis not present

## 2021-02-12 DIAGNOSIS — J069 Acute upper respiratory infection, unspecified: Secondary | ICD-10-CM

## 2021-02-12 DIAGNOSIS — Z20822 Contact with and (suspected) exposure to covid-19: Secondary | ICD-10-CM | POA: Insufficient documentation

## 2021-02-12 DIAGNOSIS — R0602 Shortness of breath: Secondary | ICD-10-CM | POA: Insufficient documentation

## 2021-02-12 LAB — RAPID INFLUENZA A&B ANTIGENS
Influenza A (ARMC): NEGATIVE
Influenza B (ARMC): NEGATIVE

## 2021-02-12 MED ORDER — BENZONATATE 100 MG PO CAPS: 100.0000 mg | ORAL_CAPSULE | Freq: Three times a day (TID) | ORAL | 0 refills | Status: DC

## 2021-02-12 MED ORDER — FLUTICASONE PROPIONATE 50 MCG/ACT NA SUSP: 2.0000 | Freq: Every day | NASAL | 0 refills | Status: AC

## 2021-02-12 NOTE — ED Provider Notes (Signed)
[facility]    CSN: [id number] Arrival date & time: 02/12/21  7989      History   Chief Complaint Chief Complaint  Patient presents with   Generalized Body Aches    HPI Vicki Wiley is a 35 y.o. female.   HPI   Cold Symptoms: Pt reports that since yesterday she has had body aches, dry cough, mild SOB and fever with a Tmax of 102F. She has tried mucinex for symptoms without much relief. She denies vomiting, chest pain, diarrhea. She does note that her work has had multiple cases of influenza and a few cases of COVID-19.    Past Medical History:  Diagnosis Date   Patient denies medical problems     Patient Active Problem List   Diagnosis Date Noted   Abdominal pain 03/04/2016    Past Surgical History:  Procedure Laterality Date   CESAREAN SECTION      OB History     Gravida  5   Para      Term      Preterm      AB  2   Living  1      SAB      IAB  2   Ectopic      Multiple      Live Births               Home Medications    Prior to Admission medications   Not on File    Family History Family History  Problem Relation Age of Onset   COPD Mother    Hypertension Father     Social History Social History   Tobacco Use   Smoking status: Every Day    Packs/day: 0.25    Years: 6.00    Pack years: 1.50    Types: Cigarettes   Smokeless tobacco: Never  Vaping Use   Vaping Use: Never used  Substance Use Topics   Alcohol use: No   Drug use: No     Allergies   Other   Review of Systems Review of Systems  As stated above in HPI Physical Exam Triage Vital Signs ED Triage Vitals  Enc Vitals Group     BP 02/12/21 0842 119/81     Pulse Rate 02/12/21 0842 (!) 107     Resp 02/12/21 0842 20     Temp 02/12/21 0842 98.8 F (37.1 C)     Temp Source 02/12/21 0842 Oral     SpO2 02/12/21 0842 98 %     Weight 02/12/21 0839 240 lb 1.3 oz (108.9 kg)     Height 02/12/21 0839 5\' 5"  (1.651 m)     Head Circumference  --      Peak Flow --      Pain Score 02/12/21 0839 10     Pain Loc --      Pain Edu? --      Excl. in GC? --    No data found.  Updated Vital Signs BP 119/81 (BP Location: Left Arm)   Pulse (!) 107   Temp 98.8 F (37.1 C) (Oral)   Resp 20   Ht 5\' 5"  (1.651 m)   Wt 240 lb 1.3 oz (108.9 kg)   LMP 01/14/2021 (Approximate)   SpO2 98%   BMI 39.95 kg/m   Physical Exam Vitals and nursing note reviewed.  Constitutional:      General: She is not in acute distress.    Appearance: Normal appearance.  She is ill-appearing. She is not toxic-appearing or diaphoretic.  HENT:     Head: Normocephalic and atraumatic.     Right Ear: Tympanic membrane normal.     Left Ear: Tympanic membrane normal.     Nose: Congestion and rhinorrhea present.     Mouth/Throat:     Mouth: Mucous membranes are moist.     Pharynx: Oropharynx is clear. No oropharyngeal exudate or posterior oropharyngeal erythema.  Eyes:     Extraocular Movements: Extraocular movements intact.     Pupils: Pupils are equal, round, and reactive to light.  Cardiovascular:     Rate and Rhythm: Normal rate and regular rhythm.     Heart sounds: Normal heart sounds.  Pulmonary:     Effort: Pulmonary effort is normal.     Breath sounds: Normal breath sounds.  Musculoskeletal:     Cervical back: Normal range of motion and neck supple.  Skin:    General: Skin is warm.     Coloration: Skin is not pale.  Neurological:     Mental Status: She is alert and oriented to person, place, and time.     UC Treatments / Results  Labs (all labs ordered are listed, but only abnormal results are displayed) Labs Reviewed  SARS CORONAVIRUS 2 (TAT 6-24 HRS)  RAPID INFLUENZA A&B ANTIGENS    EKG   Radiology No results found.  Procedures Procedures (including critical care time)  Medications Ordered in UC Medications - No data to display  Initial Impression / Assessment and Plan / UC Course  I have reviewed the triage vital signs  and the nursing notes.  Pertinent labs & imaging results that were available during my care of the patient were reviewed by me and considered in my medical decision making (see chart for details).     New. Viral panel pending. Oxygen and physical examination are reassuring. For now rest, hydration, tessalon, flonase and OTC tylenol or motrin PRN as directed on the bottle. Discussed red flag signs and symptoms. Follow up PRN.  Final Clinical Impressions(s) / UC Diagnoses   Final diagnoses:  None   Discharge Instructions   None    ED Prescriptions   None    PDMP not reviewed this encounter.   Rushie Chestnut, [place] 02/12/21 [phone]

## 2021-02-12 NOTE — ED Triage Notes (Signed)
Pt c/o body aches, cough, shortness of breath, fever (102). Started yesterday.

## 2021-02-13 LAB — SARS CORONAVIRUS 2 (TAT 6-24 HRS): SARS Coronavirus 2: NEGATIVE

## 2021-02-14 ENCOUNTER — Other Ambulatory Visit: Payer: Self-pay

## 2021-04-11 DIAGNOSIS — R7303 Prediabetes: Secondary | ICD-10-CM | POA: Insufficient documentation

## 2021-04-11 DIAGNOSIS — K219 Gastro-esophageal reflux disease without esophagitis: Secondary | ICD-10-CM | POA: Insufficient documentation

## 2021-06-22 ENCOUNTER — Ambulatory Visit: Payer: Medicaid Other | Attending: Otolaryngology

## 2021-08-29 DIAGNOSIS — U071 COVID-19: Secondary | ICD-10-CM | POA: Diagnosis not present

## 2021-08-29 DIAGNOSIS — Z87891 Personal history of nicotine dependence: Secondary | ICD-10-CM | POA: Diagnosis not present

## 2021-10-06 ENCOUNTER — Encounter: Payer: Self-pay | Admitting: Emergency Medicine

## 2021-10-06 ENCOUNTER — Ambulatory Visit
Admission: EM | Admit: 2021-10-06 | Discharge: 2021-10-06 | Disposition: A | Discharge status: Home / Self Care | Payer: Medicaid Other

## 2021-10-06 DIAGNOSIS — R519 Headache, unspecified: Secondary | ICD-10-CM | POA: Diagnosis not present

## 2022-02-12 ENCOUNTER — Ambulatory Visit: Payer: Medicaid Other | Admitting: Podiatry

## 2022-02-14 ENCOUNTER — Ambulatory Visit: Payer: Medicaid Other | Admitting: Podiatry

## 2022-02-19 ENCOUNTER — Ambulatory Visit: Payer: Medicaid Other | Admitting: Podiatry

## 2022-02-27 ENCOUNTER — Ambulatory Visit: Payer: Medicaid Other | Admitting: Podiatry

## 2022-02-27 DIAGNOSIS — B351 Tinea unguium: Secondary | ICD-10-CM | POA: Diagnosis not present

## 2022-02-27 DIAGNOSIS — B353 Tinea pedis: Secondary | ICD-10-CM

## 2022-02-27 MED ORDER — TERBINAFINE HCL 250 MG PO TABS: 250.0000 mg | ORAL_TABLET | Freq: Every day | ORAL | 0 refills | Status: AC

## 2022-02-27 MED ORDER — CLOTRIMAZOLE-BETAMETHASONE 1-0.05 % EX CREA: 1.0000 | TOPICAL_CREAM | Freq: Two times a day (BID) | CUTANEOUS | 3 refills | Status: AC

## 2022-02-27 NOTE — Progress Notes (Signed)
   Chief Complaint  Patient presents with   Nail Problem    Patient is here for right foot great toe turned black and itchy.    Subjective: 36 y.o. female presenting today as a new patient for evaluation of discoloration thickening and tenderness to the right hallux nail plate that has been ongoing for the last year.  Gradual onset.  She says that she went to the beach and developed the discoloration of the toenails.  She also developed itching and burning between the toes and skin.  She has not done anything currently for treatment  Past Medical History:  Diagnosis Date   Patient denies medical problems     Past Surgical History:  Procedure Laterality Date   CESAREAN SECTION      Allergies  Allergen Reactions   Other Swelling    Allergy to CATFISH    Objective: Physical Exam General: The patient is alert and oriented x3 in no acute distress.  Dermatology: Hyperkeratotic, discolored, thickened, onychodystrophy noted right hallux nail plate. Skin is warm, dry and supple bilateral lower extremities. Negative for open lesions or macerations.  Vascular: Palpable pedal pulses bilaterally. No edema or erythema noted. Capillary refill within normal limits.  Neurological: Epicritic and protective threshold grossly intact bilaterally.   Musculoskeletal Exam: No pedal deformity noted  Assessment: #1 Onychomycosis of toenail right hallux nail plate #2 tinea pedis both  Plan of Care:  #1 Patient was evaluated. #2  Today we discussed different treatment options including oral, topical, and laser antifungal treatment modalities.  We discussed their efficacies and side effects.  Patient opts for oral antifungal treatment modality #3 prescription for Lamisil 250 mg #90 daily. Pt denies a history of liver pathology or symptoms.  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 Felecia Shelling, DPM    [address] [place], [place] [address]                Office [phone]  Fax [phone]

## 2022-06-05 ENCOUNTER — Encounter: Payer: Medicaid Other | Admitting: Podiatry

## 2022-12-13 DIAGNOSIS — Z716 Tobacco abuse counseling: Secondary | ICD-10-CM | POA: Diagnosis not present

## 2022-12-13 DIAGNOSIS — Z01419 Encounter for gynecological examination (general) (routine) without abnormal findings: Secondary | ICD-10-CM | POA: Diagnosis not present

## 2023-01-15 ENCOUNTER — Ambulatory Visit
Admission: EM | Admit: 2023-01-15 | Discharge: 2023-01-15 | Disposition: A | Discharge status: Home / Self Care | Payer: Medicaid Other | Attending: Emergency Medicine | Admitting: Emergency Medicine

## 2023-01-15 DIAGNOSIS — J40 Bronchitis, not specified as acute or chronic: Secondary | ICD-10-CM

## 2023-01-15 DIAGNOSIS — F17200 Nicotine dependence, unspecified, uncomplicated: Secondary | ICD-10-CM | POA: Diagnosis not present

## 2023-01-15 DIAGNOSIS — U071 COVID-19: Secondary | ICD-10-CM

## 2023-01-15 HISTORY — DX: Essential (primary) hypertension: I10

## 2023-01-15 LAB — SARS CORONAVIRUS 2 BY RT PCR: SARS Coronavirus 2 by RT PCR: POSITIVE — AB

## 2023-01-15 MED ORDER — BENZONATATE 100 MG PO CAPS: 100.0000 mg | ORAL_CAPSULE | Freq: Three times a day (TID) | ORAL | 0 refills | Status: AC | PRN

## 2023-01-15 MED ORDER — ALBUTEROL SULFATE HFA 108 (90 BASE) MCG/ACT IN AERS: 2.0000 | INHALATION_SPRAY | RESPIRATORY_TRACT | 0 refills | Status: AC | PRN

## 2023-01-15 NOTE — Discharge Instructions (Addendum)
You are covid positive. Use inhaler as directed. Stop smoking, push fluids. The recommendations suggest returning to normal activities when, for at least 24 hours, symptoms are improving overall, and if a fever was present, it has been gone without use of a fever-reducing medication.  Once people resume normal activities, they are encouraged to take additional prevention strategies for the next 5 days to curb disease spread, such as taking more steps for cleaner air, enhancing hygiene practices, wearing a well-fitting mask, keeping a distance from others, and/or getting tested for respiratory viruses

## 2023-01-15 NOTE — ED Provider Notes (Signed)
[facility]    CSN: [id number] Arrival date & time: 01/15/23  0834      History   Chief Complaint Chief Complaint  Patient presents with   Cough   Nasal Congestion   Generalized Body Aches    HPI Vicki Wiley is a 37 y.o. female.   37 year old female, Vicki Wiley presents to urgent care for evaluation of cough and congestion x 3 days.  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   The history is provided by the patient. No language interpreter was used.    Past Medical History:  Diagnosis Date   Hypertension    Patient denies medical problems     Patient Active Problem List   Diagnosis Date Noted   Bronchitis 01/15/2023   Smoker 01/15/2023   COVID-19 01/15/2023   Abdominal pain 03/04/2016    Past Surgical History:  Procedure Laterality Date   CESAREAN SECTION      OB History     Gravida  5   Para      Term      Preterm      AB  2   Living  1      SAB      IAB  2   Ectopic      Multiple      Live Births               Home Medications    Prior to Admission medications   Medication Sig Start Date End Date Taking? Authorizing Provider  albuterol (VENTOLIN HFA) 108 (90 Base) MCG/ACT inhaler Inhale 2 puffs into the lungs every 4 (four) hours as needed for wheezing or shortness of breath. 01/15/23  Yes Corinthian Mizrahi, Para March, NP  hydrochlorothiazide (MICROZIDE) 12.5 MG capsule TAKE 1 CAPSULE BY MOUTH ONCE A DAY FOR HIGH BLOOD PRESSURE 10/24/22  Yes [provider]  metFORMIN (GLUCOPHAGE) 500 MG tablet TAKE 1 TABLET BY MOUTH ONCE A DAY FOR DIABETES 10/28/22  Yes [provider]  benzonatate (TESSALON) 100 MG capsule Take 1 capsule (100 mg total) by mouth 3 (three) times daily as needed for up to 5 days for cough. 01/15/23 01/20/23  Brittain Smithey, Para March, NP  clotrimazole-betamethasone (LOTRISONE) cream Apply 1 Application topically 2 (two) times daily. 02/27/22   Felecia Shelling, DPM   fluticasone (FLONASE) 50 MCG/ACT nasal spray Place 2 sprays into both nostrils daily. 02/12/21   Rushie Chestnut, PA-C  terbinafine (LAMISIL) 250 MG tablet Take 1 tablet (250 mg total) by mouth daily. 02/27/22   Felecia Shelling, DPM    Family History Family History  Problem Relation Age of Onset   COPD Mother    Hypertension Father     Social History Social History   Tobacco Use   Smoking status: Every Day    Current packs/day: 0.25    Average packs/day: 0.3 packs/day for 6.0 years (1.5 ttl pk-yrs)    Types: Cigarettes   Smokeless tobacco: Never  Vaping Use   Vaping status: Never Used  Substance Use Topics   Alcohol use: No   Drug use: No     Allergies   Fish allergy and Other   Review of Systems Review of Systems  HENT:  Positive for congestion.   Respiratory:  Positive for cough.   Musculoskeletal:  Positive for myalgias.  All other systems reviewed and are negative.    Physical Exam Triage Vital Signs ED Triage Vitals  Encounter Vitals  Group     BP 01/15/23 0855 125/85     Systolic BP Percentile --      Diastolic BP Percentile --      Pulse Rate 01/15/23 0855 93     Resp 01/15/23 0855 19     Temp 01/15/23 0855 98.3 F (36.8 C)     Temp Source 01/15/23 0855 Oral     SpO2 01/15/23 0855 99 %     Weight 01/15/23 0853 278 lb (126.1 kg)     Height --      Head Circumference --      Peak Flow --      Pain Score 01/15/23 0853 6     Pain Loc --      Pain Education --      Exclude from Growth Chart --    No data found.  Updated Vital Signs BP 125/85 (BP Location: Left Arm)   Pulse 93   Temp 98.3 F (36.8 C) (Oral)   Resp 19   Wt 278 lb (126.1 kg)   LMP 01/14/2023   SpO2 99%   BMI 46.26 kg/m   Visual Acuity Right Eye Distance:   Left Eye Distance:   Bilateral Distance:    Right Eye Near:   Left Eye Near:    Bilateral Near:     Physical Exam Vitals and nursing note reviewed.  Cardiovascular:     Rate and Rhythm: Normal rate and  regular rhythm.     Pulses: Normal pulses.     Heart sounds: Normal heart sounds.  Pulmonary:     Effort: Pulmonary effort is normal.     Breath sounds: Normal breath sounds and air entry.  Neurological:     General: No focal deficit present.     Mental Status: She is alert and oriented to person, place, and time.     Cranial Nerves: No cranial nerve deficit.     Sensory: No sensory deficit.  Psychiatric:        Attention and Perception: Attention normal.        Mood and Affect: Mood normal.        Speech: Speech normal.        Behavior: Behavior normal.      UC Treatments / Results  Labs (all labs ordered are listed, but only abnormal results are displayed) Labs Reviewed  SARS CORONAVIRUS 2 BY RT PCR - Abnormal; Notable for the following components:      Result Value   SARS Coronavirus 2 by RT PCR POSITIVE (*)    All other components within normal limits    EKG   Radiology No results found.  Procedures Procedures (including critical care time)  Medications Ordered in UC Medications - No data to display  Initial Impression / Assessment and Plan / UC Course  I have reviewed the triage vital signs and the nursing notes.  Pertinent labs & imaging results that were available during my care of the patient were reviewed by me and considered in my medical decision making (see chart for details).     Ddx: Covid, Bronchitis, smoker, viral illness Final Clinical Impressions(s) / UC Diagnoses   Final diagnoses:  Bronchitis  Smoker  COVID-19     Discharge Instructions       You are covid positive. Use inhaler as directed. Stop smoking, push fluids. The recommendations suggest returning to normal activities when, for at least 24 hours, symptoms are improving overall, and if a fever was present, it has  been gone without use of a fever-reducing medication.  Once people resume normal activities, they are encouraged to take additional prevention strategies for the next  5 days to curb disease spread, such as taking more steps for cleaner air, enhancing hygiene practices, wearing a well-fitting mask, keeping a distance from others, and/or getting tested for respiratory viruses     ED Prescriptions     Medication Sig Dispense Auth. Provider   benzonatate (TESSALON) 100 MG capsule Take 1 capsule (100 mg total) by mouth 3 (three) times daily as needed for up to 5 days for cough. 15 capsule De Libman, NP   albuterol (VENTOLIN HFA) 108 (90 Base) MCG/ACT inhaler Inhale 2 puffs into the lungs every 4 (four) hours as needed for wheezing or shortness of breath. 1 each Jacquline Terrill, Para March, NP      PDMP not reviewed this encounter.   Clancy Gourd, NP 01/15/23 1754

## 2023-01-15 NOTE — ED Triage Notes (Signed)
Sx since Sunday  Cough-chest congestion-

## 2023-02-15 NOTE — Progress Notes (Unsigned)
Sleep Medicine   Office Visit  Patient Name: Vicki Wiley DOB: 06-19-1985 MRN [id number]    Chief Complaint: sleep evaluation   Brief History:  Vicki Wiley presents for an initial consult for sleep evaluation and to establish care. Patient has a 2 year history of untreated  sleep apnea. Patient reports her sleep quality is poor due to feeling tired after sleeping at night. This is noted all nights. The patient's bed partner reports  snoring and witnessed apnea at night. The patient relates the following symptoms: wakes tired every day, snoring, morning headaches, brain fog, lack of focus and energy  are also present. The patient goes to sleep at 9:30pm and wakes up at 5:30-6am.  Patient states she wakes 4 times a night to use the restroom. Sleep quality is the same when outside home environment.  Patient has noted restlessness of her legs at night that would disrupt her sleep.  The patient  relates no unusual behavior during the night.  The patient relates  ano history of psychiatric problems. The Epworth Sleepiness Score is 19 out of 24 .  The patient relates  Cardiovascular risk factors include: hypertension.  The patient reports she was tested two years ago and was positive for OSA but never obtained treatment.     ROS  General: (-) fever, (-) chills, (-) night sweat Nose and Sinuses: (-) nasal stuffiness or itchiness, (-) postnasal drip, (-) nosebleeds, (-) sinus trouble. Mouth and Throat: (-) sore throat, (-) hoarseness. Neck: (-) swollen glands, (-) enlarged thyroid, (-) neck pain. Respiratory: - cough, - shortness of breath, - wheezing. Neurologic: - numbness, - tingling. Psychiatric: - anxiety, - depression Sleep behavior: -sleep paralysis -hypnogogic hallucinations -dream enactment      -vivid dreams -cataplexy -night terrors -sleep walking   Current Medication: Outpatient Encounter Medications as of 02/18/2023  Medication Sig   buPROPion (WELLBUTRIN XL) 150 MG 24 hr tablet Take 1  tablet by mouth every morning.   Vitamin D, Ergocalciferol, (DRISDOL) 1.25 MG (50000 UNIT) CAPS capsule Take 50,000 Units by mouth once a week.   albuterol (VENTOLIN HFA) 108 (90 Base) MCG/ACT inhaler Inhale 2 puffs into the lungs every 4 (four) hours as needed for wheezing or shortness of breath.   clotrimazole-betamethasone (LOTRISONE) cream Apply 1 Application topically 2 (two) times daily.   fluticasone (FLONASE) 50 MCG/ACT nasal spray Place 2 sprays into both nostrils daily.   hydrochlorothiazide (MICROZIDE) 12.5 MG capsule TAKE 1 CAPSULE BY MOUTH ONCE A DAY FOR HIGH BLOOD PRESSURE   metFORMIN (GLUCOPHAGE) 500 MG tablet TAKE 1 TABLET BY MOUTH ONCE A DAY FOR DIABETES   terbinafine (LAMISIL) 250 MG tablet Take 1 tablet (250 mg total) by mouth daily.   No facility-administered encounter medications on file as of 02/18/2023.    Surgical History: Past Surgical History:  Procedure Laterality Date   CESAREAN SECTION      Medical History: Past Medical History:  Diagnosis Date   Hypertension    Patient denies medical problems     Family History: Non contributory to the present illness  Social History: Social History   Socioeconomic History   Marital status: Single    Spouse name: Not on file   Number of children: Not on file   Years of education: Not on file   Highest education level: Not on file  Occupational History   Not on file  Tobacco Use   Smoking status: Every Day    Current packs/day: 0.25    Average packs/day: 0.3  packs/day for 6.0 years (1.5 ttl pk-yrs)    Types: Cigarettes   Smokeless tobacco: Never  Vaping Use   Vaping status: Never Used  Substance and Sexual Activity   Alcohol use: No   Drug use: No   Sexual activity: Yes  Other Topics Concern   Not on file  Social History Narrative   Not on file   Social Determinants of Health   Financial Resource Strain: Low Risk  (06/09/2021)   Received from [facility], [facility]   Overall Financial  Resource Strain (CARDIA)    Difficulty of Paying Living Expenses: Not hard at all  Food Insecurity: No Food Insecurity (06/09/2021)   Received from [facility], [facility]   Hunger Vital Sign    Worried About Running Out of Food in the Last Year: Never true    Ran Out of Food in the Last Year: Never true  Transportation Needs: No Transportation Needs (06/09/2021)   Received from [facility], [facility]   PRAPARE - Transportation    Lack of Transportation (Medical): No    Lack of Transportation (Non-Medical): No  Physical Activity: Insufficiently Active (06/09/2021)   Received from [facility], [facility]   Exercise Vital Sign    Days of Exercise per Week: 1 day    Minutes of Exercise per Session: 30 min  Stress: No Stress Concern Present (06/09/2021)   Received from [facility], [facility] of Occupational Health - Occupational Stress Questionnaire    Feeling of Stress : Only a little  Social Connections: Unknown (08/24/2021)   Received from [facility], [facility]   Social Network    Social Network: Not on file  Recent Concern: Social Connections - Socially Isolated (06/09/2021)   Received from [facility], [facility]   Social Connection and Isolation Panel [NHANES]    Frequency of Communication with Friends and Family: Never    Frequency of Social Gatherings with Friends and Family: Never    Attends Religious Services: Never    Database administrator or Organizations: No    Attends Banker Meetings: Never    Marital Status: Separated  Intimate Partner Violence: Unknown (07/17/2021)   Received from [employer], [facility]   HITS    Physically Hurt: Not on file    Insult or Talk Down To: Not on file    Threaten Physical Harm: Not on file    Scream or Curse: Not on file    Vital Signs: Blood pressure 126/84, pulse (!) 102, resp. rate 16, height 5\' 4"  (1.626 m), weight 276 lb (125.2 kg), SpO2 97%. Body mass index is  47.38 kg/m.   Examination: General Appearance: The patient is well-developed, well-nourished, and in no distress. Neck Circumference:  Skin: Gross inspection of skin unremarkable. Head: normocephalic, no gross deformities. Eyes: no gross deformities noted. ENT: ears appear grossly normal Neurologic: Alert and oriented. No involuntary movements.    STOP BANG RISK ASSESSMENT S (snore) Have you been told that you snore?     YES   T (tired) Are you often tired, fatigued, or sleepy during the day?   YES  O (obstruction) Do you stop breathing, choke, or gasp during sleep? YES   P (pressure) Do you have or are you being treated for high blood pressure? YES   B (BMI) Is your body index greater than 35 kg/m? YES   A (age) Are you 36 years old or older? NO   N (neck)  Do you have a neck circumference greater than 16 inches?   YES   G (gender) Are you a female? NO   TOTAL STOP/BANG "YES" ANSWERS 6                                                               A STOP-Bang score of 2 or less is considered low risk, and a score of 5 or more is high risk for having either moderate or severe OSA. For people who score 3 or 4, doctors may need to perform further assessment to determine how likely they are to have OSA.         EPWORTH SLEEPINESS SCALE:  Scale:  (0)= no chance of dozing; (1)= slight chance of dozing; (2)= moderate chance of dozing; (3)= high chance of dozing  Chance  Situtation    Sitting and reading: 3    Watching TV: 3    Sitting Inactive in public: 1    As a passenger in car: 3      Lying down to rest: 3    Sitting and talking: 3    Sitting quielty after lunch: 3    In a car, stopped in traffic: 0   TOTAL SCORE:   19 out of 24    SLEEP STUDIES:  Not available, however, note in referral states pt had a sleep study in 2022. AHI 20/hr, REM AHI 60/hr, min SpO2 80% and never had a titration study after.    LABS: Recent Results (from the past 2160  hour(s))  SARS Coronavirus 2 by RT PCR (hospital order, performed in [facility] hospital lab) *cepheid single result test* Anterior Nasal Swab     Status: Abnormal   Collection Time: 01/15/23  8:57 AM   Specimen: Anterior Nasal Swab  Result Value Ref Range   SARS Coronavirus 2 by RT PCR POSITIVE (A) NEGATIVE    Comment: (NOTE) SARS-CoV-2 target nucleic acids are DETECTED  SARS-CoV-2 RNA is generally detectable in upper respiratory specimens  during the acute phase of infection.  Positive results are indicative  of the presence of the identified virus, but do not rule out bacterial infection or co-infection with other pathogens not detected by the test.  Clinical correlation with patient history and  other diagnostic information is necessary to determine patient infection status.  The expected result is negative.  Fact Sheet for Patients:   RoadLapTop.co.za   Fact Sheet for Healthcare Providers:   http://kim-miller.com/    This test is not yet approved or cleared by the [place] FDA and  has been authorized for detection and/or diagnosis of SARS-CoV-2 by FDA under an Emergency Use Authorization (EUA).  This EUA will remain in effect (meaning this test can be used) for the duration of  the COVID-19 declaration under Section 564(b)(1)  of the Act, 21 U.S.C. section 360-bbb-3(b)(1), unless the authorization is terminated or revoked sooner.   Performed at [facility], [address], [place], [place] [address]     Radiology: No results found.  No results found.  No results found.    Assessment and Plan: Patient Active Problem List   Diagnosis Date Noted   Bronchitis 01/15/2023   Smoker 01/15/2023   COVID-19 01/15/2023   GERD (gastroesophageal reflux disease) 04/11/2021  Morbid obesity due to excess calories (HCC) 04/11/2021   Prediabetes 04/11/2021   Abdominal pain 03/04/2016   History of depression  11/21/2015   1. OSA (obstructive sleep apnea) PLAN OSA:   Patient evaluation suggests high risk of sleep disordered breathing due to history of OSA noted on previous PSG in 2022. She has gasping, choking, excessive daytime sleepiness, snoring, brain fog, morning headaches.  Patient has comorbid cardiovascular risk factors including: hypertension which could be exacerbated by pathologic sleep-disordered breathing.  Suggest: PSG  to assess/treat the patient's sleep disordered breathing. The patient was also counselled on weight loss to optimize sleep health.   2. Hypertension, unspecified type Hypertension Counseling:   The following hypertensive lifestyle modification were recommended and discussed:  1. Limiting alcohol intake to less than 1 oz/day of ethanol:(24 oz of beer or 8 oz of wine or 2 oz of 100-proof whiskey). 2. Take baby ASA 81 mg daily. 3. Importance of regular aerobic exercise and losing weight. 4. Reduce dietary saturated fat and cholesterol intake for overall cardiovascular health. 5. Maintaining adequate dietary potassium, calcium, and magnesium intake. 6. Regular monitoring of the blood pressure. 7. Reduce sodium intake to less than 100 mmol/day (less than 2.3 gm of sodium or less than 6 gm of sodium choride)    3. Morbid obesity due to excess calories (HCC) Obesity Counseling: Had a lengthy discussion regarding patients BMI and weight issues. Patient was instructed on portion control as well as increased activity. Also discussed caloric restrictions with trying to maintain intake less than 2000 Kcal. Discussions were made in accordance with the 5As of weight management. Simple actions such as not eating late and if able to, taking a walk is suggested.      General Counseling: I have discussed the findings of the evaluation and examination with Suzette Battiest.  I have also discussed any further diagnostic evaluation thatmay be needed or ordered today. Chrystie verbalizes understanding  of the findings of todays visit. We also reviewed her medications today and discussed drug interactions and side effects including but not limited excessive drowsiness and altered mental states. We also discussed that there is always a risk not just to her but also people around her. she has been encouraged to call the office with any questions or concerns that should arise related to todays visit.  No orders of the defined types were placed in this encounter.       I have personally obtained a history, evaluated the patient, evaluated pertinent data, formulated the assessment and plan and placed orders.   This patient was seen today by Emmaline Kluver, PA-C in collaboration with Dr. Freda Munro.   Yevonne Pax, MD [facility] Diplomate ABMS Pulmonary and Critical Care Medicine Sleep medicine

## 2023-02-18 ENCOUNTER — Ambulatory Visit (INDEPENDENT_AMBULATORY_CARE_PROVIDER_SITE_OTHER): Payer: Medicaid Other | Admitting: Internal Medicine

## 2023-02-18 VITALS — BP 126/84 | HR 102 | Resp 16 | Ht 64.0 in | Wt 276.0 lb

## 2023-02-18 DIAGNOSIS — G4733 Obstructive sleep apnea (adult) (pediatric): Secondary | ICD-10-CM | POA: Insufficient documentation

## 2023-02-18 DIAGNOSIS — I10 Essential (primary) hypertension: Secondary | ICD-10-CM | POA: Diagnosis not present

## 2023-08-08 DIAGNOSIS — M25562 Pain in left knee: Secondary | ICD-10-CM | POA: Diagnosis not present

## 2023-08-08 DIAGNOSIS — Z1389 Encounter for screening for other disorder: Secondary | ICD-10-CM | POA: Diagnosis not present

## 2023-08-08 DIAGNOSIS — E119 Type 2 diabetes mellitus without complications: Secondary | ICD-10-CM | POA: Diagnosis not present

## 2023-08-08 DIAGNOSIS — Z0131 Encounter for examination of blood pressure with abnormal findings: Secondary | ICD-10-CM | POA: Diagnosis not present

## 2023-08-08 DIAGNOSIS — Z0130 Encounter for examination of blood pressure without abnormal findings: Secondary | ICD-10-CM | POA: Diagnosis not present

## 2023-08-08 DIAGNOSIS — K439 Ventral hernia without obstruction or gangrene: Secondary | ICD-10-CM | POA: Diagnosis not present

## 2023-08-20 DIAGNOSIS — K439 Ventral hernia without obstruction or gangrene: Secondary | ICD-10-CM | POA: Diagnosis not present

## 2023-10-09 DIAGNOSIS — Z113 Encounter for screening for infections with a predominantly sexual mode of transmission: Secondary | ICD-10-CM | POA: Diagnosis not present

## 2023-10-09 DIAGNOSIS — Z1389 Encounter for screening for other disorder: Secondary | ICD-10-CM | POA: Diagnosis not present

## 2023-10-09 DIAGNOSIS — Z0131 Encounter for examination of blood pressure with abnormal findings: Secondary | ICD-10-CM | POA: Diagnosis not present

## 2023-10-23 DIAGNOSIS — K439 Ventral hernia without obstruction or gangrene: Secondary | ICD-10-CM | POA: Diagnosis not present

## 2023-10-29 DIAGNOSIS — K439 Ventral hernia without obstruction or gangrene: Secondary | ICD-10-CM | POA: Diagnosis not present

## 2023-12-05 DIAGNOSIS — Z1331 Encounter for screening for depression: Secondary | ICD-10-CM | POA: Diagnosis not present

## 2023-12-05 DIAGNOSIS — Z0131 Encounter for examination of blood pressure with abnormal findings: Secondary | ICD-10-CM | POA: Diagnosis not present

## 2023-12-05 DIAGNOSIS — Z1389 Encounter for screening for other disorder: Secondary | ICD-10-CM | POA: Diagnosis not present

## 2023-12-05 DIAGNOSIS — Z0130 Encounter for examination of blood pressure without abnormal findings: Secondary | ICD-10-CM | POA: Diagnosis not present

## 2023-12-05 DIAGNOSIS — F17200 Nicotine dependence, unspecified, uncomplicated: Secondary | ICD-10-CM | POA: Diagnosis not present

## 2023-12-05 DIAGNOSIS — Z87410 Personal history of cervical dysplasia: Secondary | ICD-10-CM | POA: Diagnosis not present

## 2023-12-15 DIAGNOSIS — R112 Nausea with vomiting, unspecified: Secondary | ICD-10-CM | POA: Diagnosis not present

## 2023-12-15 DIAGNOSIS — I10 Essential (primary) hypertension: Secondary | ICD-10-CM | POA: Diagnosis not present

## 2023-12-15 DIAGNOSIS — E119 Type 2 diabetes mellitus without complications: Secondary | ICD-10-CM | POA: Diagnosis not present

## 2023-12-15 DIAGNOSIS — F17210 Nicotine dependence, cigarettes, uncomplicated: Secondary | ICD-10-CM | POA: Diagnosis not present

## 2023-12-15 DIAGNOSIS — R1013 Epigastric pain: Secondary | ICD-10-CM | POA: Diagnosis not present

## 2023-12-16 DIAGNOSIS — R1013 Epigastric pain: Secondary | ICD-10-CM | POA: Diagnosis not present

## 2024-02-24 DIAGNOSIS — I10 Essential (primary) hypertension: Secondary | ICD-10-CM | POA: Diagnosis not present

## 2024-02-24 DIAGNOSIS — G4733 Obstructive sleep apnea (adult) (pediatric): Secondary | ICD-10-CM | POA: Diagnosis not present

## 2024-02-24 DIAGNOSIS — Z1389 Encounter for screening for other disorder: Secondary | ICD-10-CM | POA: Diagnosis not present

## 2024-02-24 DIAGNOSIS — Z23 Encounter for immunization: Secondary | ICD-10-CM | POA: Diagnosis not present

## 2024-02-24 DIAGNOSIS — Z0130 Encounter for examination of blood pressure without abnormal findings: Secondary | ICD-10-CM | POA: Diagnosis not present

## 2024-02-24 DIAGNOSIS — Z0131 Encounter for examination of blood pressure with abnormal findings: Secondary | ICD-10-CM | POA: Diagnosis not present

## 2024-03-08 ENCOUNTER — Ambulatory Visit
Admission: EM | Admit: 2024-03-08 | Discharge: 2024-03-08 | Disposition: A | Discharge status: Home / Self Care | Attending: Emergency Medicine | Admitting: Emergency Medicine

## 2024-03-08 DIAGNOSIS — R6884 Jaw pain: Secondary | ICD-10-CM | POA: Diagnosis not present

## 2024-03-08 DIAGNOSIS — E119 Type 2 diabetes mellitus without complications: Secondary | ICD-10-CM | POA: Diagnosis not present

## 2024-03-08 DIAGNOSIS — S0993XA Unspecified injury of face, initial encounter: Secondary | ICD-10-CM | POA: Diagnosis not present

## 2024-03-08 DIAGNOSIS — F17210 Nicotine dependence, cigarettes, uncomplicated: Secondary | ICD-10-CM | POA: Diagnosis not present

## 2024-03-08 DIAGNOSIS — R000 Tachycardia, unspecified: Secondary | ICD-10-CM | POA: Diagnosis not present

## 2024-03-08 DIAGNOSIS — I10 Essential (primary) hypertension: Secondary | ICD-10-CM | POA: Diagnosis not present

## 2024-03-08 NOTE — ED Provider Notes (Signed)
 [facility]    CSN: [id number] Arrival date & time: 03/08/24  1509      History   Chief Complaint Chief Complaint  Patient presents with   Jaw Pain   Fall    HPI EMMANUELA GHAZI is a 38 y.o. female presenting for right sided jaw pain and swelling following fall that occurred yesterday.  Patient says she fell directly onto cement.  Now she states she cannot fully open her mouth and is crying in pain.  She has been taking Tylenol  and it has not helped at all.  Difficulty chewing, swallowing due to the pain.  No injury to her teeth.  Concerned about a potential fracture.  HPI  Past Medical History:  Diagnosis Date   Hypertension    Patient denies medical problems     Patient Active Problem List   Diagnosis Date Noted   OSA (obstructive sleep apnea) 02/18/2023   Hypertension 02/18/2023   Bronchitis 01/15/2023   Smoker 01/15/2023   COVID-19 01/15/2023   GERD (gastroesophageal reflux disease) 04/11/2021   Morbid obesity due to excess calories (HCC) 04/11/2021   Prediabetes 04/11/2021   Abdominal pain 03/04/2016   History of depression 11/21/2015    Past Surgical History:  Procedure Laterality Date   CESAREAN SECTION      OB History     Gravida  5   Para      Term      Preterm      AB  2   Living  1      SAB      IAB  2   Ectopic      Multiple      Live Births               Home Medications    Prior to Admission medications   Medication Sig Start Date End Date Taking? Authorizing Provider  ZEPBOUND 5 MG/0.5ML Pen  03/02/24  Yes [provider]  albuterol  (VENTOLIN  HFA) 108 (90 Base) MCG/ACT inhaler Inhale 2 puffs into the lungs every 4 (four) hours as needed for wheezing or shortness of breath. 01/15/23   Defelice, Jeanette, NP  buPROPion (WELLBUTRIN XL) 150 MG 24 hr tablet Take 1 tablet by mouth every morning. 10/24/22   [provider]  clotrimazole -betamethasone  (LOTRISONE ) cream Apply 1 Application topically  2 (two) times daily. 02/27/22   Janit Thresa HERO, DPM  fluticasone  (FLONASE ) 50 MCG/ACT nasal spray Place 2 sprays into both nostrils daily. 02/12/21   Tonette Lauraine HERO, PA-C  hydrochlorothiazide (MICROZIDE) 12.5 MG capsule TAKE 1 CAPSULE BY MOUTH ONCE A DAY FOR HIGH BLOOD PRESSURE 10/24/22   [provider]  metFORMIN (GLUCOPHAGE) 500 MG tablet TAKE 1 TABLET BY MOUTH ONCE A DAY FOR DIABETES 10/28/22   [provider]  terbinafine  (LAMISIL ) 250 MG tablet Take 1 tablet (250 mg total) by mouth daily. 02/27/22   Janit Thresa HERO, DPM  Vitamin D, Ergocalciferol, (DRISDOL) 1.25 MG (50000 UNIT) CAPS capsule Take 50,000 Units by mouth once a week. 10/28/22   [provider]    Family History Family History  Problem Relation Age of Onset   COPD Mother    Hypertension Father     Social History Social History   Tobacco Use   Smoking status: Every Day    Current packs/day: 0.25    Average packs/day: 0.3 packs/day for 6.0 years (1.5 ttl pk-yrs)    Types: Cigarettes   Smokeless tobacco: Never  Vaping Use  Vaping status: Never Used  Substance Use Topics   Alcohol use: No   Drug use: No     Allergies   Fish allergy and Other   Review of Systems Review of Systems  HENT:  Positive for facial swelling and trouble swallowing. Negative for sore throat.   Musculoskeletal:  Positive for neck pain.  Neurological:  Negative for weakness and headaches.     Physical Exam Triage Vital Signs ED Triage Vitals  Encounter Vitals Group     BP 03/08/24 1559 (!) 151/122     Girls Systolic BP Percentile --      Girls Diastolic BP Percentile --      Boys Systolic BP Percentile --      Boys Diastolic BP Percentile --      Pulse Rate 03/08/24 1559 95     Resp --      Temp 03/08/24 1559 98.6 F (37 C)     Temp Source 03/08/24 1559 Oral     SpO2 03/08/24 1559 98 %     Weight 03/08/24 1601 252 lb (114.3 kg)     Height --      Head Circumference --      Peak Flow --       Pain Score 03/08/24 1558 9     Pain Loc --      Pain Education --      Exclude from Growth Chart --    No data found.  Updated Vital Signs BP (!) 151/122 (BP Location: Left Arm)   Pulse 95   Temp 98.6 F (37 C) (Oral)   Wt 252 lb (114.3 kg)   LMP 03/08/2024   SpO2 98%   BMI 43.26 kg/m       Physical Exam Vitals and nursing note reviewed.  Constitutional:      General: She is not in acute distress.    Appearance: Normal appearance. She is not ill-appearing or toxic-appearing.  HENT:     Head: Normocephalic and atraumatic.     Jaw: Tenderness, swelling and pain on movement present.     Comments: Patient has significant swelling along the right jaw.  Tenderness present along right jaw.  Patient is barely able to open mouth due to discomfort and she is tearful.  No dental injury.    Nose: Nose normal.     Mouth/Throat:     Mouth: Mucous membranes are moist.     Pharynx: Oropharynx is clear.  Eyes:     General: No scleral icterus.       Right eye: No discharge.        Left eye: No discharge.     Conjunctiva/sclera: Conjunctivae normal.  Cardiovascular:     Rate and Rhythm: Normal rate.  Pulmonary:     Effort: Pulmonary effort is normal. No respiratory distress.  Musculoskeletal:     Cervical back: Normal range of motion and neck supple.  Skin:    General: Skin is dry.  Neurological:     General: No focal deficit present.     Mental Status: She is alert. Mental status is at baseline.     Cranial Nerves: Cranial nerve deficit present.     Motor: No weakness.     Coordination: Coordination normal.     Gait: Gait normal.  Psychiatric:        Mood and Affect: Mood normal.        Behavior: Behavior normal.      UC Treatments / Results  Labs (  all labs ordered are listed, but only abnormal results are displayed) Labs Reviewed - No data to display  EKG   Radiology No results found.  Procedures Procedures (including critical care time)  Medications Ordered  in UC Medications - No data to display  Initial Impression / Assessment and Plan / UC Course  I have reviewed the triage vital signs and the nursing notes.  Pertinent labs & imaging results that were available during my care of the patient were reviewed by me and considered in my medical decision making (see chart for details).   38 year old female presents for right jaw pain and swelling following an accidental fall and facial injury on cement yesterday.  Denies loss of consciousness or headache.  She is unable to fully open her mouth due to the significant pain.  She is tearful.  She is not having any relief with Tylenol .  On evaluation, patient has significant swelling along the right jaw.  Tenderness present along right jaw.  Patient is barely able to open mouth due to discomfort and she is tearful.  No dental injury.  Grossly normal cranial nerve exam.  Concern for jaw fracture.  Advised ED evaluation for CT scan.  Patient is agreeable.  Leaving in stable condition.   Final Clinical Impressions(s) / UC Diagnoses   Final diagnoses:  Injury of jaw, initial encounter     Discharge Instructions      - Concern for fracture of your jaw.  I advise you go to the emergency department immediately for CT scan and pain management.  You have been advised to follow up immediately in the emergency department for concerning signs.symptoms. If you declined EMS transport, please have a family member take you directly to the ED at this time. Do not delay. Based on concerns about condition, if you do not follow up in th e ED, you may risk poor outcomes including worsening of condition, delayed treatment and potentially life threatening issues. If you have declined to go to the ED at this time, you should call your PCP immediately to set up a follow up appointment.  Go to ED for red flag symptoms, including; fevers you cannot reduce with Tylenol /Motrin, severe headaches, vision changes,  numbness/weakness in part of the body, lethargy, confusion, intractable vomiting, severe dehydration, chest pain, breathing difficulty, severe persistent abdominal or pelvic pain, signs of severe infection (increased redness, swelling of an area), feeling faint or passing out, dizziness, etc. You should especially go to the ED for sudden acute worsening of condition if you do not elect to go at this time.    ED Prescriptions   None    PDMP not reviewed this encounter.   Arvis Jolan NOVAK, PA-C 03/08/24 [phone]

## 2024-03-08 NOTE — Discharge Instructions (Signed)
-   Concern for fracture of your jaw.  I advise you go to the emergency department immediately for CT scan and pain management.  You have been advised to follow up immediately in the emergency department for concerning signs.symptoms. If you declined EMS transport, please have a family member take you directly to the ED at this time. Do not delay. Based on concerns about condition, if you do not follow up in th e ED, you may risk poor outcomes including worsening of condition, delayed treatment and potentially life threatening issues. If you have declined to go to the ED at this time, you should call your PCP immediately to set up a follow up appointment.  Go to ED for red flag symptoms, including; fevers you cannot reduce with Tylenol /Motrin, severe headaches, vision changes, numbness/weakness in part of the body, lethargy, confusion, intractable vomiting, severe dehydration, chest pain, breathing difficulty, severe persistent abdominal or pelvic pain, signs of severe infection (increased redness, swelling of an area), feeling faint or passing out, dizziness, etc. You should especially go to the ED for sudden acute worsening of condition if you do not elect to go at this time.

## 2024-03-08 NOTE — ED Notes (Signed)
 Patient is being discharged from the Urgent Care and sent to the Emergency Department via private vehicle . Per Jolan Host, PA, patient is in need of higher level of care due to needing CT scan. Patient is aware and verbalizes understanding of plan of care.  Vitals:   03/08/24 1559  BP: (!) 151/122  Pulse: 95  Temp: 98.6 F (37 C)  SpO2: 98%

## 2024-03-08 NOTE — ED Triage Notes (Signed)
 Pt c/o fall on 11.22.25  Pt states that she fell while walking up the stairs and hit her face against the ground  Pt has right side jaw pain and states she has trouble talking, chewing, and swallowing.

## 2024-05-11 ENCOUNTER — Emergency Department

## 2024-05-11 ENCOUNTER — Other Ambulatory Visit: Payer: Self-pay

## 2024-05-11 ENCOUNTER — Emergency Department: Admission: EM | Admit: 2024-05-11 | Discharge: 2024-05-11 | Disposition: A | Discharge status: Home / Self Care

## 2024-05-11 DIAGNOSIS — I10 Essential (primary) hypertension: Secondary | ICD-10-CM | POA: Insufficient documentation

## 2024-05-11 DIAGNOSIS — R1032 Left lower quadrant pain: Secondary | ICD-10-CM | POA: Diagnosis present

## 2024-05-11 DIAGNOSIS — R197 Diarrhea, unspecified: Secondary | ICD-10-CM | POA: Diagnosis not present

## 2024-05-11 DIAGNOSIS — D72829 Elevated white blood cell count, unspecified: Secondary | ICD-10-CM | POA: Insufficient documentation

## 2024-05-11 DIAGNOSIS — R112 Nausea with vomiting, unspecified: Secondary | ICD-10-CM | POA: Insufficient documentation

## 2024-05-11 DIAGNOSIS — Z720 Tobacco use: Secondary | ICD-10-CM | POA: Insufficient documentation

## 2024-05-11 LAB — CBC WITH DIFFERENTIAL/PLATELET
Abs Immature Granulocytes: 0.05 10*3/uL (ref 0.00–0.07)
Basophils Absolute: 0.1 10*3/uL (ref 0.0–0.1)
Basophils Relative: 1 %
Eosinophils Absolute: 0.3 10*3/uL (ref 0.0–0.5)
Eosinophils Relative: 3 %
HCT: 42.6 % (ref 36.0–46.0)
Hemoglobin: 14.3 g/dL (ref 12.0–15.0)
Immature Granulocytes: 0 %
Lymphocytes Relative: 40 %
Lymphs Abs: 4.8 10*3/uL — ABNORMAL HIGH (ref 0.7–4.0)
MCH: 27.5 pg (ref 26.0–34.0)
MCHC: 33.6 g/dL (ref 30.0–36.0)
MCV: 81.9 fL (ref 80.0–100.0)
Monocytes Absolute: 0.6 10*3/uL (ref 0.1–1.0)
Monocytes Relative: 5 %
Neutro Abs: 6.1 10*3/uL (ref 1.7–7.7)
Neutrophils Relative %: 51 %
Platelets: 317 10*3/uL (ref 150–400)
RBC: 5.2 MIL/uL — ABNORMAL HIGH (ref 3.87–5.11)
RDW: 14.4 % (ref 11.5–15.5)
WBC: 12.1 10*3/uL — ABNORMAL HIGH (ref 4.0–10.5)
nRBC: 0 % (ref 0.0–0.2)

## 2024-05-11 LAB — HCG, QUANTITATIVE, PREGNANCY: hCG, Beta Chain, Quant, S: <1 m[IU]/mL

## 2024-05-11 LAB — URINALYSIS, ROUTINE W REFLEX MICROSCOPIC
Bilirubin Urine: NEGATIVE
Glucose, UA: NEGATIVE mg/dL
Hgb urine dipstick: NEGATIVE
Ketones, ur: NEGATIVE mg/dL
Leukocytes,Ua: NEGATIVE
Nitrite: NEGATIVE
Protein, ur: 30 mg/dL — AB
Specific Gravity, Urine: 1.024 (ref 1.005–1.030)
pH: 5 (ref 5.0–8.0)

## 2024-05-11 LAB — COMPREHENSIVE METABOLIC PANEL WITH GFR
ALT: 15 U/L (ref 0–44)
AST: 19 U/L (ref 15–41)
Albumin: 4.3 g/dL (ref 3.5–5.0)
Alkaline Phosphatase: 84 U/L (ref 38–126)
Anion gap: 11 (ref 5–15)
BUN: 16 mg/dL (ref 6–20)
CO2: 21 mmol/L — ABNORMAL LOW (ref 22–32)
Calcium: 9.5 mg/dL (ref 8.9–10.3)
Chloride: 105 mmol/L (ref 98–111)
Creatinine, Ser: 0.81 mg/dL (ref 0.44–1.00)
GFR, Estimated: >60 mL/min
Glucose, Bld: 109 mg/dL — ABNORMAL HIGH (ref 70–99)
Potassium: 3.9 mmol/L (ref 3.5–5.1)
Sodium: 137 mmol/L (ref 135–145)
Total Bilirubin: <0.2 mg/dL (ref 0.0–1.2)
Total Protein: 7.1 g/dL (ref 6.5–8.1)

## 2024-05-11 LAB — SAMPLE TO BLOOD BANK

## 2024-05-11 LAB — POC URINE PREG, ED: Preg Test, Ur: NEGATIVE

## 2024-05-11 LAB — LIPASE, BLOOD: Lipase: 31 U/L (ref 11–51)

## 2024-05-11 MED ORDER — KETOROLAC TROMETHAMINE 15 MG/ML IJ SOLN
15.0000 mg | Freq: Once | INTRAMUSCULAR | Status: AC
  Administered 2024-05-11: 15 mg via INTRAVENOUS
  Filled 2024-05-11: qty 1

## 2024-05-11 MED ORDER — IBUPROFEN 200 MG PO TABS: 600.0000 mg | ORAL_TABLET | Freq: Three times a day (TID) | ORAL | 2 refills | Status: AC | PRN

## 2024-05-11 MED ORDER — ONDANSETRON 4 MG PO TBDP: 4.0000 mg | ORAL_TABLET | Freq: Three times a day (TID) | ORAL | 0 refills | Status: AC | PRN

## 2024-05-11 MED ORDER — IOHEXOL 350 MG/ML SOLN
100.0000 mL | Freq: Once | INTRAVENOUS | Status: AC | PRN
  Administered 2024-05-11: 100 mL via INTRAVENOUS

## 2024-05-11 MED ORDER — ACETAMINOPHEN 500 MG PO TABS: 1000.0000 mg | ORAL_TABLET | Freq: Four times a day (QID) | ORAL | 2 refills | Status: AC | PRN

## 2024-05-11 MED ORDER — LOPERAMIDE HCL 2 MG PO TABS: 2.0000 mg | ORAL_TABLET | Freq: Four times a day (QID) | ORAL | 0 refills | Status: AC | PRN

## 2024-05-11 MED ORDER — ACETAMINOPHEN 500 MG PO TABS
1000.0000 mg | ORAL_TABLET | Freq: Once | ORAL | Status: AC
  Administered 2024-05-11: 1000 mg via ORAL
  Filled 2024-05-11: qty 2

## 2024-05-11 NOTE — ED Triage Notes (Signed)
 Pt comes in via pov with complaints of abdominal pain and diarrhea, and vomiting since Sunday. Pt complains of left sided lower abdominal pain. Pt states that she took pregnancy test on Saturday, and it was positive. Pt had a tubal ligation back in 2020. Pt denies bleeding or spotting. Pt complains of pain 7/10.

## 2024-05-11 NOTE — ED Provider Notes (Signed)
----------------------------------------- °  9:10 PM on 05/11/2024 -----------------------------------------  Blood pressure (!) 140/97, pulse 83, temperature 98.3 F (36.8 C), resp. rate 16, height 5' 4 (1.626 m), weight 114.3 kg, last menstrual period 04/05/2024, SpO2 100%.  Assuming care from Dr. Clarine.  In short, Vicki Wiley is a 39 y.o. female with a chief complaint of left lower quadrant abdominal pain.  Refer to the original H&P for additional details.  The current plan of care is to await urinalysis and CT abdomen and pelvis.  Make appropriate disposition.  ----------------------------------------- 9:10 PM on 05/11/2024 -----------------------------------------  Please see clinical course note.  Patient stable condition for discharge home.   Clinical Course as of 05/11/24 2110  Mon May 11, 2024  1909 CBC with mild leukocytosis, nonspecific.  CMP reviewed, overall unremarkable other than mildly low bicarb.  hCG urine and quantitative negative. [MM]  2054 CT ABDOMEN PELVIS W CONTRAST IMPRESSION: 1. Large infraumbilical ventral hernia containing omental fat and distal small bowel loops, without acute abnormality identified. 2. 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 Correlation with clinical history may be helpful for confirmation   [MH]  2054 CT abdomen pelvis obtained for left lower abdominal pain demonstrates a large infraumbilical ventral hernia containing omental fat and distal bowel without evidence of obstruction, strangulation or ischemia. Small bowel loops without acute abnormality.  Patient was already aware of this finding. This is not new. We will refer to general surgery. Strict return precautions were discussed.   CT also notes numerous left adnexal varies suggestive of pelvic venous insufficiency. Patient does not have OBGYN. She does report past experience with pelvic pain, suggesting this is a chronic  problem. We will refer to OBGYN.   No peritoneal signs on my exam.  Patient is clinically stable with pain controlled. She is ready to be discharged.  No indication for emergent surgical or gynecologic intervention at this time.  Patient is appropriate for outpatient follow-up with the general surgery and OB/GYN.  We discussed strict ED return precautions in which she verbalized understanding.  [MH]  2106 Urinalysis, Routine w reflex microscopic -Urine, Clean Catch(!) No infectious etiology. [MH]    Clinical Course User Index [MH] Margrette Monte A, PA-C [MM] Clarine Ozell DELENA, MD     Medications  acetaminophen  (TYLENOL ) tablet 1,000 mg (1,000 mg Oral Given 05/11/24 1935)  ketorolac  (TORADOL ) 15 MG/ML injection 15 mg (15 mg Intravenous Given 05/11/24 1935)  iohexol  (OMNIPAQUE ) 350 MG/ML injection 100 mL (100 mLs Intravenous Contrast Given 05/11/24 2012)     ED Discharge Orders          Ordered    loperamide  (IMODIUM  A-D) 2 MG tablet  4 times daily PRN        05/11/24 2011    acetaminophen  (TYLENOL ) 500 MG tablet  Every 6 hours PRN        05/11/24 2011    ibuprofen  (MOTRIN  IB) 200 MG tablet  Every 8 hours PRN        05/11/24 2011    ondansetron  (ZOFRAN -ODT) 4 MG disintegrating tablet  Every 8 hours PRN        05/11/24 2011           Final diagnoses:  Left lower quadrant abdominal pain  Diarrhea, unspecified type  Nausea and vomiting, unspecified vomiting type      Margrette Monte DELENA, PA-C 05/11/24 2110    Clarine Ozell DELENA, MD 05/13/24 0045

## 2024-05-11 NOTE — ED Notes (Signed)
 RN called lab to see if they could add on the Lipase. They said they are doing it.

## 2024-05-11 NOTE — ED Notes (Signed)
 See triage note  Presents with some lower abd pain  States pain moves around to left lower back   Some nausea  Afebrile

## 2024-05-11 NOTE — ED Provider Notes (Addendum)
 "  [facility] Provider Note    Event Date/Time   First MD Initiated Contact with Patient 05/11/24 1815     (approximate)   History   Abdominal Pain  Pt comes in via pov with complaints of abdominal pain and diarrhea, and vomiting since Sunday. Pt complains of left sided lower abdominal pain. Pt states that she took pregnancy test on Saturday, and it was positive. Pt had a tubal ligation back in 2020. Pt denies bleeding or spotting. Pt complains of pain 7/10.   HPI Vicki Wiley is a 39 y.o. female PMH GERD, prediabetes, BMI greater than 40, tobacco use, hypertension presents for evaluation of abdominal pain, diarrhea, vomiting - Patient has been having left lower quadrant abdominal for 2-3 days, radiates to left flank.  Did have multiple episodes of yellowish clear diarrhea yesterday and some today.  Did have some vomiting yesterday as well.  Abdominal surgical history of C-section.  No vaginal bleeding or discharge.  No known sick contacts.  No URI symptoms. -Did a home pregnancy test yesterday and thought the result was positive -No recent antibiotics, no recent travel     Physical Exam   Triage Vital Signs: ED Triage Vitals  Encounter Vitals Group     BP 05/11/24 1618 (!) 155/103     Girls Systolic BP Percentile --      Girls Diastolic BP Percentile --      Boys Systolic BP Percentile --      Boys Diastolic BP Percentile --      Pulse Rate 05/11/24 1618 97     Resp 05/11/24 1618 17     Temp 05/11/24 1618 98.3 F (36.8 C)     Temp src --      SpO2 05/11/24 1618 99 %     Weight 05/11/24 1619 251 lb 15.8 oz (114.3 kg)     Height 05/11/24 1619 5' 4 (1.626 m)     Head Circumference --      Peak Flow --      Pain Score 05/11/24 1618 7     Pain Loc --      Pain Education --      Exclude from Growth Chart --     Most recent vital signs: Vitals:   05/11/24 1618 05/11/24 1941  BP: (!) 155/103 (!) 140/97  Pulse: 97 83  Resp: 17 16  Temp: 98.3 F  (36.8 C)   SpO2: 99% 100%     General: Awake, no distress.  CV:  Good peripheral perfusion. RRR, RP 2+ Resp:  Normal effort. CTAB Abd:  No distention.  Moderate tenderness to palpation of left lower quadrant only.  Mild left CVA tenderness.   ED Results / Procedures / Treatments   Labs (all labs ordered are listed, but only abnormal results are displayed) Labs Reviewed  CBC WITH DIFFERENTIAL/PLATELET - Abnormal; Notable for the following components:      Result Value   WBC 12.1 (*)    RBC 5.20 (*)    Lymphs Abs 4.8 (*)    All other components within normal limits  COMPREHENSIVE METABOLIC PANEL WITH GFR - Abnormal; Notable for the following components:   CO2 21 (*)    Glucose, Bld 109 (*)    All other components within normal limits  HCG, QUANTITATIVE, PREGNANCY  URINALYSIS, ROUTINE W REFLEX MICROSCOPIC  LIPASE, BLOOD  POC URINE PREG, ED  SAMPLE TO BLOOD BANK     EKG  N/a   RADIOLOGY  Pending.     PROCEDURES:  Critical Care performed: No  Procedures   MEDICATIONS ORDERED IN ED: Medications  acetaminophen  (TYLENOL ) tablet 1,000 mg (1,000 mg Oral Given 05/11/24 1935)  ketorolac  (TORADOL ) 15 MG/ML injection 15 mg (15 mg Intravenous Given 05/11/24 1935)  iohexol  (OMNIPAQUE ) 350 MG/ML injection 100 mL (100 mLs Intravenous Contrast Given 05/11/24 2012)     IMPRESSION / MDM / ASSESSMENT AND PLAN / ED COURSE  I reviewed the triage vital signs and the nursing notes.                              DDX/MDM/AP: Differential diagnosis includes, but is not limited to, diverticulitis, gastroenteritis, consider underlying UTI/pyelonephritis, urolithiasis.  Doubt ovarian pathology given localization and abdomen, do not clinically suspect PID/TOA given no associated symptoms.  Also doubt cholecystitis, pancreatitis given localization.  Do not clinically suspect C. Difficile. Hcg testing from triage guarding negative, do not suspect ectopic pregnancy or other pregnancy related  pathology.  Plan: - Labs - CT abdomen pelvis - Tylenol , Toradol   Patient's presentation is most consistent with acute presentation with potential threat to life or bodily function.  The patient is on the cardiac monitor to evaluate for evidence of arrhythmia and/or significant heart rate changes.  ED course below.  Labs with very mild leukocytosis, CMP overall unremarkable.  hCG negative.  Urinalysis and lipase pending as well as CT.  Patient signed out to [facility] PA-C pending results of CT and urinalysis.  If unremarkable, stable for discharge home from my perspective.  Clinical Course as of 05/11/24 2014  Mon May 11, 2024  1909 CBC with mild leukocytosis, nonspecific.  CMP reviewed, overall unremarkable other than mildly low bicarb.  hCG urine and quantitative negative. [MM]    Clinical Course User Index [MM] Clarine Ozell LABOR, MD     FINAL CLINICAL IMPRESSION(S) / ED DIAGNOSES   Final diagnoses:  Left lower quadrant abdominal pain  Diarrhea, unspecified type  Nausea and vomiting, unspecified vomiting type     Rx / DC Orders   ED Discharge Orders          Ordered    loperamide  (IMODIUM  A-D) 2 MG tablet  4 times daily PRN        05/11/24 2011    acetaminophen  (TYLENOL ) 500 MG tablet  Every 6 hours PRN        05/11/24 2011    ibuprofen  (MOTRIN  IB) 200 MG tablet  Every 8 hours PRN        05/11/24 2011    ondansetron  (ZOFRAN -ODT) 4 MG disintegrating tablet  Every 8 hours PRN        05/11/24 2011             Note:  This document was prepared using Dragon voice recognition software and may include unintentional dictation errors.   Clarine Ozell LABOR, MD 05/11/24 2013    Clarine Ozell LABOR, MD 05/11/24 2014  "

## 2024-05-11 NOTE — Discharge Instructions (Addendum)
 Your lab work was overall reassuring.  Your CT abdomen pelvis reveal the findings below.  As discussed please follow-up with general surgery for large ventral hernia.  Follow-up with OB/GYN for further evaluation of possible pelvis congestive syndrome  IMPRESSION: 1. Large infraumbilical ventral hernia containing omental fat and distal small bowel loops, without acute abnormality identified. 2. 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 Correlation with clinical history may be helpful for Confirmation  Return to ED if new or worsening symptoms.
